# Patient Record
Sex: Female | Born: 1960 | Race: Black or African American | Hispanic: No | Marital: Married | Smoking: Never smoker
Health system: Southern US, Community
[De-identification: ages and names within clinical notes are randomized; demographics above are authoritative.]

## PROBLEM LIST (undated history)

## (undated) DIAGNOSIS — R42 Dizziness and giddiness: Secondary | ICD-10-CM

## (undated) DIAGNOSIS — I10 Essential (primary) hypertension: Secondary | ICD-10-CM

## (undated) DIAGNOSIS — E785 Hyperlipidemia, unspecified: Secondary | ICD-10-CM

## (undated) DIAGNOSIS — K219 Gastro-esophageal reflux disease without esophagitis: Secondary | ICD-10-CM

## (undated) HISTORY — PX: OTHER SURGICAL HISTORY: SHX169

## (undated) HISTORY — DX: Hyperlipidemia, unspecified: E78.5

---

## 1997-12-26 ENCOUNTER — Inpatient Hospital Stay (HOSPITAL_COMMUNITY): Admission: AD | Admit: 1997-12-26 | Discharge: 1997-12-26 | Payer: Self-pay | Admitting: Obstetrics and Gynecology

## 1998-04-12 ENCOUNTER — Ambulatory Visit (HOSPITAL_COMMUNITY): Admission: RE | Admit: 1998-04-12 | Discharge: 1998-04-12 | Payer: Self-pay | Admitting: Obstetrics and Gynecology

## 1998-07-04 ENCOUNTER — Inpatient Hospital Stay (HOSPITAL_COMMUNITY): Admission: AD | Admit: 1998-07-04 | Discharge: 1998-07-04 | Payer: Self-pay | Admitting: Obstetrics and Gynecology

## 1998-07-08 ENCOUNTER — Inpatient Hospital Stay (HOSPITAL_COMMUNITY): Admission: AD | Admit: 1998-07-08 | Discharge: 1998-07-08 | Payer: Self-pay | Admitting: Obstetrics and Gynecology

## 1998-07-11 ENCOUNTER — Inpatient Hospital Stay (HOSPITAL_COMMUNITY): Admission: AD | Admit: 1998-07-11 | Discharge: 1998-07-15 | Payer: Self-pay | Admitting: Obstetrics and Gynecology

## 1998-07-11 ENCOUNTER — Encounter: Payer: Self-pay | Admitting: Obstetrics and Gynecology

## 2001-02-07 ENCOUNTER — Ambulatory Visit (HOSPITAL_COMMUNITY): Admission: RE | Admit: 2001-02-07 | Discharge: 2001-02-07 | Payer: Self-pay | Admitting: Pulmonary Disease

## 2001-05-30 ENCOUNTER — Ambulatory Visit (HOSPITAL_COMMUNITY): Admission: RE | Admit: 2001-05-30 | Discharge: 2001-05-30 | Payer: Self-pay | Admitting: Pulmonary Disease

## 2001-09-26 ENCOUNTER — Other Ambulatory Visit: Admission: RE | Admit: 2001-09-26 | Discharge: 2001-09-26 | Payer: Self-pay | Admitting: Obstetrics and Gynecology

## 2002-06-23 ENCOUNTER — Ambulatory Visit (HOSPITAL_COMMUNITY): Admission: RE | Admit: 2002-06-23 | Discharge: 2002-06-23 | Payer: Self-pay | Admitting: Pulmonary Disease

## 2003-02-27 ENCOUNTER — Encounter: Payer: Self-pay | Admitting: Obstetrics and Gynecology

## 2003-02-27 ENCOUNTER — Ambulatory Visit (HOSPITAL_COMMUNITY): Admission: RE | Admit: 2003-02-27 | Discharge: 2003-02-27 | Payer: Self-pay | Admitting: Obstetrics and Gynecology

## 2004-07-03 ENCOUNTER — Other Ambulatory Visit: Admission: RE | Admit: 2004-07-03 | Discharge: 2004-07-03 | Payer: Self-pay | Admitting: Obstetrics and Gynecology

## 2006-06-28 ENCOUNTER — Ambulatory Visit (HOSPITAL_COMMUNITY): Admission: RE | Admit: 2006-06-28 | Discharge: 2006-06-28 | Payer: Self-pay | Admitting: Pulmonary Disease

## 2009-07-10 ENCOUNTER — Ambulatory Visit (HOSPITAL_BASED_OUTPATIENT_CLINIC_OR_DEPARTMENT_OTHER): Admission: RE | Admit: 2009-07-10 | Discharge: 2009-07-10 | Payer: Self-pay | Admitting: Pulmonary Disease

## 2009-07-10 ENCOUNTER — Ambulatory Visit: Payer: Self-pay | Admitting: Diagnostic Radiology

## 2010-05-19 ENCOUNTER — Ambulatory Visit (HOSPITAL_COMMUNITY): Admission: RE | Admit: 2010-05-19 | Discharge: 2010-05-19 | Payer: Self-pay | Admitting: Pulmonary Disease

## 2010-11-13 ENCOUNTER — Other Ambulatory Visit: Payer: Self-pay | Admitting: Neurology

## 2010-11-13 DIAGNOSIS — R42 Dizziness and giddiness: Secondary | ICD-10-CM

## 2010-11-21 ENCOUNTER — Ambulatory Visit
Admission: RE | Admit: 2010-11-21 | Discharge: 2010-11-21 | Disposition: A | Discharge status: Home / Self Care | Payer: Self-pay | Source: Ambulatory Visit | Attending: Neurology | Admitting: Neurology

## 2010-11-21 DIAGNOSIS — R42 Dizziness and giddiness: Secondary | ICD-10-CM

## 2011-11-10 ENCOUNTER — Other Ambulatory Visit (HOSPITAL_COMMUNITY): Payer: Self-pay | Admitting: Pulmonary Disease

## 2011-11-10 ENCOUNTER — Ambulatory Visit (HOSPITAL_COMMUNITY)
Admission: RE | Admit: 2011-11-10 | Discharge: 2011-11-10 | Disposition: A | Discharge status: Home / Self Care | Payer: BC Managed Care – PPO | Source: Ambulatory Visit | Attending: Pulmonary Disease | Admitting: Pulmonary Disease

## 2011-11-10 DIAGNOSIS — R200 Anesthesia of skin: Secondary | ICD-10-CM

## 2011-11-10 DIAGNOSIS — R209 Unspecified disturbances of skin sensation: Secondary | ICD-10-CM | POA: Insufficient documentation

## 2011-11-10 DIAGNOSIS — Z139 Encounter for screening, unspecified: Secondary | ICD-10-CM

## 2011-11-10 DIAGNOSIS — M542 Cervicalgia: Secondary | ICD-10-CM | POA: Insufficient documentation

## 2011-11-19 ENCOUNTER — Ambulatory Visit (HOSPITAL_COMMUNITY): Payer: BC Managed Care – PPO

## 2011-11-24 ENCOUNTER — Ambulatory Visit (HOSPITAL_COMMUNITY): Payer: BC Managed Care – PPO

## 2012-05-02 ENCOUNTER — Ambulatory Visit (HOSPITAL_COMMUNITY)
Admission: RE | Admit: 2012-05-02 | Discharge: 2012-05-02 | Disposition: A | Discharge status: Home / Self Care | Payer: BC Managed Care – PPO | Source: Ambulatory Visit | Attending: Pulmonary Disease | Admitting: Pulmonary Disease

## 2012-05-02 DIAGNOSIS — Z1231 Encounter for screening mammogram for malignant neoplasm of breast: Secondary | ICD-10-CM | POA: Insufficient documentation

## 2012-05-02 DIAGNOSIS — Z139 Encounter for screening, unspecified: Secondary | ICD-10-CM

## 2012-06-27 ENCOUNTER — Emergency Department (HOSPITAL_COMMUNITY): Payer: BC Managed Care – PPO

## 2012-06-27 ENCOUNTER — Encounter (HOSPITAL_COMMUNITY): Payer: Self-pay

## 2012-06-27 ENCOUNTER — Emergency Department (HOSPITAL_COMMUNITY)
Admission: EM | Admit: 2012-06-27 | Discharge: 2012-06-27 | Disposition: A | Discharge status: Home / Self Care | Payer: BC Managed Care – PPO | Attending: Emergency Medicine | Admitting: Emergency Medicine

## 2012-06-27 DIAGNOSIS — Z79899 Other long term (current) drug therapy: Secondary | ICD-10-CM | POA: Insufficient documentation

## 2012-06-27 DIAGNOSIS — R091 Pleurisy: Secondary | ICD-10-CM

## 2012-06-27 DIAGNOSIS — I10 Essential (primary) hypertension: Secondary | ICD-10-CM | POA: Insufficient documentation

## 2012-06-27 DIAGNOSIS — R05 Cough: Secondary | ICD-10-CM | POA: Insufficient documentation

## 2012-06-27 DIAGNOSIS — R059 Cough, unspecified: Secondary | ICD-10-CM | POA: Insufficient documentation

## 2012-06-27 HISTORY — DX: Essential (primary) hypertension: I10

## 2012-06-27 LAB — BASIC METABOLIC PANEL
BUN: 10 mg/dL (ref 6–23)
CO2: 29 mEq/L (ref 19–32)
Calcium: 9.1 mg/dL (ref 8.4–10.5)
Chloride: 102 mEq/L (ref 96–112)
Creatinine, Ser: 0.77 mg/dL (ref 0.50–1.10)
GFR calc Af Amer: >90 mL/min (ref >90)
GFR calc non Af Amer: >90 mL/min (ref >90)
Glucose, Bld: 98 mg/dL (ref 70–99)
Potassium: 3.5 mEq/L (ref 3.5–5.1)
Sodium: 140 mEq/L (ref 135–145)

## 2012-06-27 LAB — CBC
HCT: 38.6 % (ref 36.0–46.0)
Hemoglobin: 13 g/dL (ref 12.0–15.0)
MCH: 28.4 pg (ref 26.0–34.0)
MCHC: 33.7 g/dL (ref 30.0–36.0)
MCV: 84.3 fL (ref 78.0–100.0)
Platelets: 324 10*3/uL (ref 150–400)
RBC: 4.58 MIL/uL (ref 3.87–5.11)
RDW: 12.9 % (ref 11.5–15.5)
WBC: 14.3 10*3/uL — ABNORMAL HIGH (ref 4.0–10.5)

## 2012-06-27 LAB — D-DIMER, QUANTITATIVE: D-Dimer, Quant: 0.44 ug/mL-FEU (ref 0.00–0.48)

## 2012-06-27 LAB — TROPONIN I: Troponin I: <0.3 ng/mL (ref <0.30)

## 2012-06-27 NOTE — ED Notes (Signed)
Pt reports cp "off and on" for about a week.  Pt reports pressure towards the left side of her chest that radiates to her back.

## 2012-06-27 NOTE — ED Provider Notes (Signed)
History   This chart was scribed for Joya Gaskins, MD by Charolett Bumpers, ED Scribe. The patient was seen in room APA19/APA19. Patient's care was started at 1048.   CSN: [id number]  Arrival date & time 06/27/12  1030   First MD Initiated Contact with Patient 06/27/12 1048      Chief Complaint  Patient presents with  . Chest Pain    The history is provided by the patient. No language interpreter was used.  Rachel Collier is a 52 y.o. female who presents to the Emergency Department complaining of constant, moderate left sided chest pain that radiates to her back that started this morning. She states she has had the same pain intermittently over the past couple of weeks but was mild. She states her symptoms worsened this morning. She states the pain is pressure-like and aggravated with deep breathing. She states she has no pain at rest. She states the back pain is worse with sitting up and movement. She states she had a cough prior to onset of chest pain and now only has a dry intermittent cough. She states she had a episode of reflux yesterday, but none today. She denies any fevers, SOB, severe pain with ambulation, diaphoresis, pain or swelling of legs, abdominal pain, nausea, syncope. She denies any h/o DVT, PE. She denies any cardiac hx.   PCP: Dr. Juanetta Gosling   Past Medical History  Diagnosis Date  . Hypertension     Past Surgical History  Procedure Date  . Cesarean section     No family history on file.  History  Substance Use Topics  . Smoking status: Never Smoker   . Smokeless tobacco: Not on file  . Alcohol Use: No    OB History    Grav Para Term Preterm Abortions TAB SAB Ect Mult Living                  Review of Systems  Constitutional: Negative for fever and diaphoresis.  Respiratory: Positive for cough. Negative for shortness of breath.   Cardiovascular: Positive for chest pain. Negative for leg swelling.  Gastrointestinal: Negative for nausea and  abdominal pain.  Musculoskeletal: Positive for back pain. Negative for gait problem.  Neurological: Negative for syncope.  All other systems reviewed and are negative.    Allergies  Review of patient's allergies indicates no known allergies.  Home Medications   Current Outpatient Rx  Name  Route  Sig  Dispense  Refill  . ACETAMINOPHEN 500 MG PO TABS   Oral   Take 1,000 mg by mouth every 6 (six) hours as needed. Fever/headache         . AMLODIPINE BESY-BENAZEPRIL HCL 5-10 MG PO CAPS   Oral   Take 1 capsule by mouth at bedtime.         . IBUPROFEN 200 MG PO TABS   Oral   Take 400 mg by mouth every 12 (twelve) hours as needed. Fever/aches and pain           BP 136/80  Temp 98.1 F (36.7 C)  Ht 5\' 6"  (1.676 m)  Wt 178 lb (80.74 kg)  BMI 28.73 kg/m2  Physical Exam CONSTITUTIONAL: Well developed/well nourished HEAD AND FACE: Normocephalic/atraumatic EYES: EOMI/PERRL ENMT: Mucous membranes moist NECK: supple no meningeal signs SPINE:entire spine nontender. Tenderness to thoracic paraspinal region CV: S1/S2 noted, no murmurs/rubs/gallops noted Chest - no tenderness noted, no crepitance LUNGS: Lungs are clear to auscultation bilaterally, no apparent distress ABDOMEN: soft,  nontender, no rebound or guarding GU:no cva tenderness NEURO: Pt is awake/alert, moves all extremitiesx4 EXTREMITIES: pulses normal, full ROM, no calf tenderness or edema.  SKIN: warm, color normal PSYCH: no abnormalities of mood noted  ED Course  Procedures   DIAGNOSTIC STUDIES: Oxygen Saturation is 97% on room air, adequate by my interpretation.    COORDINATION OF CARE:  11:10-Discussed planned course of treatment with the patient including a chest x-ray and basic blood screening, who is agreeable at this time.   Pt well appearing, no distress.  I doubt ACS.  Troponin had already been sent and is negative.  Do not feel further troponins needed.  Most of her pain was associated with deep  breathing.  She is low risk and D-dimer negative.  Suspect pleurisy.  Doubt acs/dissection/pericarditis/myocarditis.  Stable for d/c  Results for orders placed during the hospital encounter of 06/27/12  CBC      Component Value Range   WBC 14.3 (*) 4.0 - 10.5 K/uL   RBC 4.58  3.87 - 5.11 MIL/uL   Hemoglobin 13.0  12.0 - 15.0 g/dL   HCT 16.1  09.6 - 04.5 %   MCV 84.3  78.0 - 100.0 fL   MCH 28.4  26.0 - 34.0 pg   MCHC 33.7  30.0 - 36.0 g/dL   RDW 40.9  81.1 - 91.4 %   Platelets 324  150 - 400 K/uL  BASIC METABOLIC PANEL      Component Value Range   Sodium 140  135 - 145 mEq/L   Potassium 3.5  3.5 - 5.1 mEq/L   Chloride 102  96 - 112 mEq/L   CO2 29  19 - 32 mEq/L   Glucose, Bld 98  70 - 99 mg/dL   BUN 10  6 - 23 mg/dL   Creatinine, Ser 7.82  0.50 - 1.10 mg/dL   Calcium 9.1  8.4 - 95.6 mg/dL   GFR calc non Af Amer >90  >90 mL/min   GFR calc Af Amer >90  >90 mL/min  TROPONIN I      Component Value Range   Troponin I <0.30  <0.30 ng/mL   Dg Chest 2 View  06/27/2012  *RADIOLOGY REPORT*  Clinical Data: Hypertension, chest pain.  CHEST - 2 VIEW  Comparison: None.  Findings: Heart is upper limits normal in size.  Mild peribronchial thickening.  No confluent opacities or effusions.  No acute bony abnormality.  IMPRESSION: Borderline heart size.  Mild bronchitic changes.   Original Report Authenticated By: Charlett Nose, M.D.          MDM  Nursing notes including past medical history and social history reviewed and considered in documentation xrays reviewed and considered Labs/vital reviewed and considered      Date: 06/27/2012  Rate: 92  Rhythm: normal sinus rhythm  QRS Axis: normal  Intervals: normal  ST/T Wave abnormalities: normal  Conduction Disutrbances:none  Narrative Interpretation:   Old EKG Reviewed: none available at time of interpretation    I personally performed the services described in this documentation, which was scribed in my presence. The recorded  information has been reviewed and is accurate.         Joya Gaskins, MD 06/27/12 [phone]

## 2012-09-29 ENCOUNTER — Other Ambulatory Visit (HOSPITAL_COMMUNITY): Payer: Self-pay | Admitting: Pulmonary Disease

## 2012-09-29 DIAGNOSIS — R109 Unspecified abdominal pain: Secondary | ICD-10-CM

## 2012-10-04 ENCOUNTER — Ambulatory Visit (HOSPITAL_COMMUNITY)
Admission: RE | Admit: 2012-10-04 | Discharge: 2012-10-04 | Disposition: A | Discharge status: Home / Self Care | Payer: BC Managed Care – PPO | Source: Ambulatory Visit | Attending: Pulmonary Disease | Admitting: Pulmonary Disease

## 2012-10-04 DIAGNOSIS — I10 Essential (primary) hypertension: Secondary | ICD-10-CM | POA: Insufficient documentation

## 2012-10-04 DIAGNOSIS — R109 Unspecified abdominal pain: Secondary | ICD-10-CM | POA: Insufficient documentation

## 2012-10-13 ENCOUNTER — Other Ambulatory Visit (HOSPITAL_COMMUNITY): Payer: Self-pay | Admitting: Pulmonary Disease

## 2012-10-13 DIAGNOSIS — IMO0002 Reserved for concepts with insufficient information to code with codable children: Secondary | ICD-10-CM

## 2012-10-14 ENCOUNTER — Ambulatory Visit (HOSPITAL_COMMUNITY)
Admission: RE | Admit: 2012-10-14 | Discharge: 2012-10-14 | Disposition: A | Discharge status: Home / Self Care | Payer: BC Managed Care – PPO | Source: Ambulatory Visit | Attending: Pulmonary Disease | Admitting: Pulmonary Disease

## 2012-10-14 ENCOUNTER — Encounter (HOSPITAL_COMMUNITY): Payer: Self-pay

## 2012-10-14 DIAGNOSIS — I10 Essential (primary) hypertension: Secondary | ICD-10-CM | POA: Insufficient documentation

## 2012-10-14 DIAGNOSIS — IMO0002 Reserved for concepts with insufficient information to code with codable children: Secondary | ICD-10-CM

## 2012-10-14 DIAGNOSIS — R1031 Right lower quadrant pain: Secondary | ICD-10-CM | POA: Insufficient documentation

## 2012-10-14 MED ORDER — IOHEXOL 300 MG/ML  SOLN
100.0000 mL | Freq: Once | INTRAMUSCULAR | Status: AC | PRN
  Administered 2012-10-14: 100 mL via INTRAVENOUS

## 2012-11-05 ENCOUNTER — Emergency Department (HOSPITAL_COMMUNITY)
Admission: EM | Admit: 2012-11-05 | Discharge: 2012-11-05 | Disposition: A | Discharge status: Home / Self Care | Payer: BC Managed Care – PPO | Attending: Emergency Medicine | Admitting: Emergency Medicine

## 2012-11-05 ENCOUNTER — Encounter (HOSPITAL_COMMUNITY): Payer: Self-pay

## 2012-11-05 DIAGNOSIS — J3489 Other specified disorders of nose and nasal sinuses: Secondary | ICD-10-CM | POA: Insufficient documentation

## 2012-11-05 DIAGNOSIS — R42 Dizziness and giddiness: Secondary | ICD-10-CM

## 2012-11-05 DIAGNOSIS — I10 Essential (primary) hypertension: Secondary | ICD-10-CM | POA: Insufficient documentation

## 2012-11-05 LAB — URINALYSIS, ROUTINE W REFLEX MICROSCOPIC
Bilirubin Urine: NEGATIVE
Glucose, UA: NEGATIVE mg/dL
Ketones, ur: NEGATIVE mg/dL
Leukocytes, UA: NEGATIVE
Nitrite: NEGATIVE
Protein, ur: NEGATIVE mg/dL
Specific Gravity, Urine: 1.015 (ref 1.005–1.030)
Urobilinogen, UA: 0.2 mg/dL (ref 0.0–1.0)
pH: 7.5 (ref 5.0–8.0)

## 2012-11-05 LAB — CBC WITH DIFFERENTIAL/PLATELET
Basophils Absolute: 0 10*3/uL (ref 0.0–0.1)
Basophils Relative: 0 % (ref 0–1)
Eosinophils Absolute: 0 10*3/uL (ref 0.0–0.7)
Eosinophils Relative: 0 % (ref 0–5)
HCT: 41.5 % (ref 36.0–46.0)
Hemoglobin: 14.3 g/dL (ref 12.0–15.0)
Lymphocytes Relative: 16 % (ref 12–46)
Lymphs Abs: 1.3 10*3/uL (ref 0.7–4.0)
MCH: 28.5 pg (ref 26.0–34.0)
MCHC: 34.5 g/dL (ref 30.0–36.0)
MCV: 82.8 fL (ref 78.0–100.0)
Monocytes Absolute: 0.3 10*3/uL (ref 0.1–1.0)
Monocytes Relative: 3 % (ref 3–12)
Neutro Abs: 6.9 10*3/uL (ref 1.7–7.7)
Neutrophils Relative %: 81 % — ABNORMAL HIGH (ref 43–77)
Platelets: 313 10*3/uL (ref 150–400)
RBC: 5.01 MIL/uL (ref 3.87–5.11)
RDW: 13.1 % (ref 11.5–15.5)
WBC: 8.6 10*3/uL (ref 4.0–10.5)

## 2012-11-05 LAB — BASIC METABOLIC PANEL
BUN: 8 mg/dL (ref 6–23)
CO2: 28 mEq/L (ref 19–32)
Calcium: 9.6 mg/dL (ref 8.4–10.5)
Chloride: 100 mEq/L (ref 96–112)
Creatinine, Ser: 0.73 mg/dL (ref 0.50–1.10)
GFR calc Af Amer: >90 mL/min (ref >90)
GFR calc non Af Amer: >90 mL/min (ref >90)
Glucose, Bld: 100 mg/dL — ABNORMAL HIGH (ref 70–99)
Potassium: 3.6 mEq/L (ref 3.5–5.1)
Sodium: 138 mEq/L (ref 135–145)

## 2012-11-05 LAB — URINE MICROSCOPIC-ADD ON

## 2012-11-05 NOTE — ED Provider Notes (Signed)
History  This chart was scribed for Joya Gaskins, MD by Shari Heritage, ED Scribe. The patient was seen in room APA02/APA02. Patient's care was started at 1110.   CSN: [id number]  Arrival date & time 11/05/12  1050   First MD Initiated Contact with Patient 11/05/12 1110      Chief Complaint  Patient presents with  . Dizziness  . Hypertension     The history is provided by the patient. No language interpreter was used.    HPI Comments: Rachel Collier is a 52 y.o. female who presents to the Emergency Department complaining of intermittent, moderate lightheadedness onset this morning. Sensation is worse with position changes. She denies dizziness or spinning sensation. She says that her blood pressure has been elevated for the past several weeks. She measured her BP at home prior to arrival and it was 135/114. Patient states that she took 1 tsp of vinegar then blood pressure decreased. Patient reports that her PMD recently increased her dose of Lotrel. She hasn't taken her BP medicines today and she usually takes them at night. She states that she has also had increased nasal congestion for the past several days. She states that she felt well yesterday. She denies headache, visual changes, chest pain, shortness of breath, vomiting, diarrhea, hematochezia, vaginal bleeding, dysuria, cough, extremity weakness, fever or diaphoresis. Patient has a medical history of hypertension. Patient has no history of MI or stroke.   Past Medical History  Diagnosis Date  . Hypertension     Past Surgical History  Procedure Laterality Date  . Cesarean section      No family history on file.  History  Substance Use Topics  . Smoking status: Never Smoker   . Smokeless tobacco: Not on file  . Alcohol Use: No    OB History   Grav Para Term Preterm Abortions TAB SAB Ect Mult Living                  Review of Systems A complete 10 system review of systems was obtained and all systems are  negative except as noted in the HPI and PMH.   Allergies  Review of patient's allergies indicates no known allergies.  Home Medications   Current Outpatient Rx  Name  Route  Sig  Dispense  Refill  . acetaminophen (TYLENOL) 500 MG tablet   Oral   Take 1,000 mg by mouth every 6 (six) hours as needed. Fever/headache         . amLODipine-benazepril (LOTREL) 5-10 MG per capsule   Oral   Take 1 capsule by mouth at bedtime.         Marland Kitchen ibuprofen (ADVIL,MOTRIN) 200 MG tablet   Oral   Take 400 mg by mouth every 12 (twelve) hours as needed. Fever/aches and pain           Triage Vitals: BP 146/91  Pulse 76  Temp(Src) 98.6 F (37 C) (Oral)  Resp 18  Ht 5\' 6"  (1.676 m)  Wt 176 lb (79.833 kg)  BMI 28.42 kg/m2  SpO2 100%  Physical Exam CONSTITUTIONAL: Well developed/well nourished HEAD: Normocephalic/atraumatic EYES: EOMI/PERRL, no nystagmus ENMT: Mucous membranes moist, bilateral TMs normal NECK: supple no meningeal signs, no bruits SPINE:entire spine nontender CV: S1/S2 noted, no murmurs/rubs/gallops noted LUNGS: Lungs are clear to auscultation bilaterally, no apparent distress ABDOMEN: soft, nontender, no rebound or guarding GU:no cva tenderness NEURO:Awake/alert, facies symmetric, no arm or leg drift is noted Cranial nerves 3/4/5/6/12/28/08/11/12 tested and intact  Gait normal without ataxia No past pointing EXTREMITIES: pulses normal, full ROM SKIN: warm, color normal PSYCH: no abnormalities of mood noted    ED Course  Procedures (including critical care time) DIAGNOSTIC STUDIES: Oxygen Saturation is 100% on room air, normal by my interpretation.    COORDINATION OF CARE: 12:11 PM- Patient informed of current plan for treatment and evaluation and agrees with plan at this time.    Pt now feels improved.  She had mild tachycardia upon standing but no other significant findings.  I doubt CVA.  I doubt MI.  She is ambulatory without difficulty.  EKG change noted but  unlikely significant  Advised to f/u with her PCP this week if no improvement   MDM  Nursing notes including past medical history and social history reviewed and considered in documentation Labs/vital reviewed and considered    Date: 11/05/2012  Rate: 80  Rhythm: normal sinus rhythm  QRS Axis: normal  Intervals: normal  ST/T Wave abnormalities: nonspecific ST changes  Conduction Disutrbances:none  Narrative Interpretation:   Old EKG Reviewed: changes noted (one t wave inversion in V3)     I personally performed the services described in this documentation, which was scribed in my presence. The recorded information has been reviewed and is accurate.      Joya Gaskins, MD 11/05/12 1335

## 2012-11-05 NOTE — ED Notes (Signed)
Pt reports that her bp has been high for about 1 month, her pmd has been changing her meds, today her bp was high, 135/114.  She took 1 tsp of vinegar and was lower. Has been feeling like her "balance was off, just this morning", has not taken any meds today, usually takes her meds at night, denies any cp, no cough or fever.

## 2012-11-05 NOTE — ED Notes (Addendum)
The patient reports a history of feeling lightheaded this morning, states that her blood pressure was elevated at home (approx 135/114).  States that she is also having urinary frequency that started today, denies dysuria.  The pt ambulatory in the department to the restroom with a steady gait.

## 2013-05-30 ENCOUNTER — Emergency Department (HOSPITAL_COMMUNITY)
Admission: EM | Admit: 2013-05-30 | Discharge: 2013-05-30 | Disposition: A | Discharge status: Home / Self Care | Payer: BC Managed Care – PPO | Attending: Emergency Medicine | Admitting: Emergency Medicine

## 2013-05-30 ENCOUNTER — Encounter (HOSPITAL_COMMUNITY): Payer: Self-pay | Admitting: Emergency Medicine

## 2013-05-30 DIAGNOSIS — R12 Heartburn: Secondary | ICD-10-CM | POA: Insufficient documentation

## 2013-05-30 DIAGNOSIS — Z79899 Other long term (current) drug therapy: Secondary | ICD-10-CM | POA: Insufficient documentation

## 2013-05-30 DIAGNOSIS — R0789 Other chest pain: Secondary | ICD-10-CM | POA: Insufficient documentation

## 2013-05-30 DIAGNOSIS — R000 Tachycardia, unspecified: Secondary | ICD-10-CM | POA: Insufficient documentation

## 2013-05-30 DIAGNOSIS — I10 Essential (primary) hypertension: Secondary | ICD-10-CM | POA: Insufficient documentation

## 2013-05-30 LAB — CBC WITH DIFFERENTIAL/PLATELET
Basophils Absolute: 0 10*3/uL (ref 0.0–0.1)
Basophils Relative: 0 % (ref 0–1)
Eosinophils Absolute: 0.1 10*3/uL (ref 0.0–0.7)
Eosinophils Relative: 1 % (ref 0–5)
HCT: 39.7 % (ref 36.0–46.0)
Hemoglobin: 13.4 g/dL (ref 12.0–15.0)
Lymphocytes Relative: 29 % (ref 12–46)
Lymphs Abs: 3.2 10*3/uL (ref 0.7–4.0)
MCH: 28.5 pg (ref 26.0–34.0)
MCHC: 33.8 g/dL (ref 30.0–36.0)
MCV: 84.5 fL (ref 78.0–100.0)
Monocytes Absolute: 0.9 10*3/uL (ref 0.1–1.0)
Monocytes Relative: 8 % (ref 3–12)
Neutro Abs: 6.9 10*3/uL (ref 1.7–7.7)
Neutrophils Relative %: 62 % (ref 43–77)
Platelets: 306 10*3/uL (ref 150–400)
RBC: 4.7 MIL/uL (ref 3.87–5.11)
RDW: 12.8 % (ref 11.5–15.5)
WBC: 11 10*3/uL — ABNORMAL HIGH (ref 4.0–10.5)

## 2013-05-30 LAB — BASIC METABOLIC PANEL
BUN: 7 mg/dL (ref 6–23)
CO2: 25 mEq/L (ref 19–32)
Calcium: 9.2 mg/dL (ref 8.4–10.5)
Chloride: 97 mEq/L (ref 96–112)
Creatinine, Ser: 0.8 mg/dL (ref 0.50–1.10)
GFR calc Af Amer: >90 mL/min (ref >90)
GFR calc non Af Amer: 84 mL/min — ABNORMAL LOW (ref >90)
Glucose, Bld: 147 mg/dL — ABNORMAL HIGH (ref 70–99)
Potassium: 3.4 mEq/L — ABNORMAL LOW (ref 3.5–5.1)
Sodium: 136 mEq/L (ref 135–145)

## 2013-05-30 LAB — TROPONIN I
Troponin I: <0.3 ng/mL (ref <0.30)
Troponin I: <0.3 ng/mL (ref <0.30)

## 2013-05-30 LAB — D-DIMER, QUANTITATIVE: D-Dimer, Quant: 0.39 ug/mL-FEU (ref 0.00–0.48)

## 2013-05-30 MED ORDER — FAMOTIDINE 20 MG PO TABS
20.0000 mg | ORAL_TABLET | Freq: Once | ORAL | Status: AC
  Administered 2013-05-30: 20 mg via ORAL

## 2013-05-30 MED ORDER — FAMOTIDINE 20 MG PO TABS
ORAL_TABLET | ORAL | Status: AC
  Filled 2013-05-30: qty 1

## 2013-05-30 MED ORDER — RANITIDINE HCL 150 MG PO CAPS: 150.0000 mg | ORAL_CAPSULE | Freq: Every day | ORAL | Status: DC

## 2013-05-30 MED ORDER — ONDANSETRON HCL 4 MG/2ML IJ SOLN: 4.0000 mg | Freq: Once | INTRAMUSCULAR | Status: DC

## 2013-05-30 MED ORDER — ONDANSETRON 4 MG PO TBDP
4.0000 mg | ORAL_TABLET | Freq: Once | ORAL | Status: AC
  Administered 2013-05-30: 4 mg via ORAL

## 2013-05-30 MED ORDER — ONDANSETRON 4 MG PO TBDP
ORAL_TABLET | ORAL | Status: AC
  Filled 2013-05-30: qty 1

## 2013-05-30 MED ORDER — GI COCKTAIL ~~LOC~~
30.0000 mL | Freq: Once | ORAL | Status: AC
  Administered 2013-05-30: 30 mL via ORAL
  Filled 2013-05-30: qty 30

## 2013-05-30 NOTE — ED Notes (Signed)
Pt c/o sob since 11pm,

## 2013-05-30 NOTE — ED Provider Notes (Signed)
CSN: [id number]     Arrival date & time 05/30/13  0258 History   None    Chief Complaint  Patient presents with  . Shortness of Breath   (Consider location/radiation/quality/duration/timing/severity/associated sxs/prior Treatment) HPI Comments: Rachel Collier is a 52 year old female with a history of hypertension which is usually well-controlled who presents with a complaint of "heartburn" and shortness of breath since approximately 11:00 this evening. She states that she had a large meal of a hamburger, french fries and ice cream after the sports event this evening, proceeded to lay down on her couch and fall asleep after which time she awoke feeling this discomfort in her chest. This is persistent, nothing seems to make it better though it does get worse when she lays down. She denies any significant pain in her chest but describes it more as heartburn. She has no cough, fever, nausea, vomiting, diaphoresis, swelling of her legs, abdominal pain, dysuria or diarrhea. She does report a recent trip to [place] in the car, does not smoke cigarettes, does not use oral contraceptive pills and has no other risk factors for pulmonary embolism or heart disease. At this time her symptoms are moderate. She does report that until 3 weeks ago she was exercising regularly at the gym and had no dyspnea or limiting symptoms. The reason that she stopped was because of the holiday, not because of symptoms.  Patient is a 52 y.o. female presenting with shortness of breath. The history is provided by the patient and the spouse.  Shortness of Breath   Past Medical History  Diagnosis Date  . Hypertension    Past Surgical History  Procedure Laterality Date  . Cesarean section     History reviewed. No pertinent family history. History  Substance Use Topics  . Smoking status: Never Smoker   . Smokeless tobacco: Not on file  . Alcohol Use: No   OB History   Grav Para Term Preterm Abortions TAB SAB Ect Mult  Living                 Review of Systems  Respiratory: Positive for shortness of breath.   All other systems reviewed and are negative.    Allergies  Review of patient's allergies indicates no known allergies.  Home Medications   Current Outpatient Rx  Name  Route  Sig  Dispense  Refill  . acetaminophen (TYLENOL) 500 MG tablet   Oral   Take 1,000 mg by mouth every 6 (six) hours as needed. Fever/headache         . amLODipine-benazepril (LOTREL) 10-20 MG per capsule   Oral   Take 1 capsule by mouth daily.         Marland Kitchen ibuprofen (ADVIL,MOTRIN) 200 MG tablet   Oral   Take 400 mg by mouth every 12 (twelve) hours as needed. Fever/aches and pain         . loratadine (CLARITIN) 10 MG tablet   Oral   Take 10 mg by mouth daily as needed for allergies.         Marland Kitchen meclizine (ANTIVERT) 25 MG tablet   Oral   Take 25 mg by mouth 3 (three) times daily as needed for dizziness.         . methylPREDNISolone (MEDROL DOSEPAK) 4 MG tablet   Oral   Take 4 mg by mouth. follow package directions         . ranitidine (ZANTAC) 150 MG capsule   Oral   Take 1 capsule (  150 mg total) by mouth daily.   30 capsule   0    BP 133/90  Pulse 98  Temp(Src) 98 F (36.7 C) (Oral)  Resp 20  Ht 5\' 6"  (1.676 m)  Wt 180 lb (81.647 kg)  BMI 29.07 kg/m2  SpO2 96% Physical Exam  Nursing note and vitals reviewed. Constitutional: She appears well-developed and well-nourished. No distress.  HENT:  Head: Normocephalic and atraumatic.  Mouth/Throat: Oropharynx is clear and moist. No oropharyngeal exudate.  Eyes: Conjunctivae and EOM are normal. Pupils are equal, round, and reactive to light. Right eye exhibits no discharge. Left eye exhibits no discharge. No scleral icterus.  Neck: Normal range of motion. Neck supple. No JVD present. No thyromegaly present.  Cardiovascular: Regular rhythm, normal heart sounds and intact distal pulses.  Tachycardia present.  Exam reveals no gallop and no friction  rub.   No murmur heard. Pulmonary/Chest: Effort normal and breath sounds normal. No respiratory distress. She has no wheezes. She has no rales.  Abdominal: Soft. Bowel sounds are normal. She exhibits no distension and no mass. There is no tenderness.  Musculoskeletal: Normal range of motion. She exhibits no edema and no tenderness.  Lymphadenopathy:    She has no cervical adenopathy.  Neurological: She is alert. Coordination normal.  Skin: Skin is warm and dry. No rash noted. No erythema.  Psychiatric: She has a normal mood and affect. Her behavior is normal.    ED Course  Procedures (including critical care time) Labs Review Labs Reviewed  CBC WITH DIFFERENTIAL - Abnormal; Notable for the following:    WBC 11.0 (*)    All other components within normal limits  BASIC METABOLIC PANEL - Abnormal; Notable for the following:    Potassium 3.4 (*)    Glucose, Bld 147 (*)    GFR calc non Af Amer 84 (*)    All other components within normal limits  D-DIMER, QUANTITATIVE  TROPONIN I  TROPONIN I   Imaging Review No results found.  EKG Interpretation    Date/Time:  Tuesday May 30 2013 03:21:38 EST Ventricular Rate:  108 PR Interval:  184 QRS Duration: 76 QT Interval:  348 QTC Calculation: 466 R Axis:   61 Text Interpretation:  Sinus tachycardia Possible Left atrial enlargement Borderline ECG When compared with ECG of 05-Nov-2012 11:54, T wave abnromality in lead 3 with slight change, rate slightly faster Confirmed by Briceida Rasberry  MD, Lora Glomski ([id number]) on 05/30/2013 4:10:46 AM            MDM   1. Chest discomfort    The patient has normal lung sounds, normal heart sounds, a normal exam except for mild tachycardia. Because of her recent travel will obtain a d-dimer to rule out pulmonary embolism though suspect that this could be related to the large meal that she ate this evening.  reexam at 4:20 - has ongoing mild sx, - seems to get worse with laying down - heart rate has slowed  to 96, has nausea.  Labs with normal d dimer and normal BMP / CBC other than scant leukocytosis.  Check Troponin   Has had 2 normal sets of troponins and a normal d dimer - she has improved with meds - pulse remains < 100, endorses feeling better   Pt will f/u with Dr. Juanetta Gosling  Meds given in ED:  Medications  gi cocktail (Maalox,Lidocaine,Donnatal) (30 mLs Oral Given 05/30/13 0324)  famotidine (PEPCID) tablet 20 mg (20 mg Oral Given 05/30/13 0424)  ondansetron (ZOFRAN-ODT)  disintegrating tablet 4 mg (4 mg Oral Given 05/30/13 0425)    New Prescriptions   RANITIDINE (ZANTAC) 150 MG CAPSULE    Take 1 capsule (150 mg total) by mouth daily.      Vida Roller, MD 05/30/13 [phone]

## 2013-06-26 ENCOUNTER — Telehealth: Payer: Self-pay

## 2013-06-26 NOTE — Telephone Encounter (Signed)
Pt was referred by Dr. Luan Pulling for screening colonoscopy.

## 2013-06-26 NOTE — Telephone Encounter (Signed)
LMOM for a return call.  

## 2013-07-06 ENCOUNTER — Telehealth: Payer: Self-pay | Admitting: *Deleted

## 2013-07-06 NOTE — Telephone Encounter (Signed)
Pt called to set up a colonoscopy. Please advise [phone]

## 2013-07-07 ENCOUNTER — Other Ambulatory Visit: Payer: Self-pay

## 2013-07-07 DIAGNOSIS — Z1211 Encounter for screening for malignant neoplasm of colon: Secondary | ICD-10-CM

## 2013-07-07 NOTE — Telephone Encounter (Signed)
See separate note dated 07/06/2013.

## 2013-07-07 NOTE — Telephone Encounter (Signed)
I returned pt's call and LMOM that if she does not return call soon, I will be leaving shortly this morning for a funeral today, and I will be out of the office on Mon.   I will try to call her on Tuesday, 07/11/2013.

## 2013-07-12 NOTE — Telephone Encounter (Signed)
MOVI PREP SPLIT DOSING, REGULAR BREAKFAST. CLEAR LIQUIDS AFTER 9 AM.  

## 2013-07-12 NOTE — Telephone Encounter (Signed)
Gastroenterology Pre-Procedure Review  Request Date: 07/07/2013 Requesting Physician: Dr. Luan Pulling  PATIENT REVIEW QUESTIONS: The patient responded to the following health history questions as indicated:    1. Diabetes Melitis: no 2. Joint replacements in the past 12 months: no 3. Major health problems in the past 3 months: no 4. Has an artificial valve or MVP: no 5. Has a defibrillator: no 6. Has been advised in past to take antibiotics in advance of a procedure like teeth cleaning: no    MEDICATIONS & ALLERGIES:    Patient reports the following regarding taking any blood thinners:   Plavix? no Aspirin? no Coumadin? no  Patient confirms/reports the following medications:  Current Outpatient Prescriptions  Medication Sig Dispense Refill  . acetaminophen (TYLENOL) 500 MG tablet Take 1,000 mg by mouth every 6 (six) hours as needed. Fever/headache      . amLODipine-benazepril (LOTREL) 10-20 MG per capsule Take 1 capsule by mouth daily.      Marland Kitchen ibuprofen (ADVIL,MOTRIN) 200 MG tablet Take 400 mg by mouth every 12 (twelve) hours as needed. Fever/aches and pain      . loratadine (CLARITIN) 10 MG tablet Take 10 mg by mouth daily as needed for allergies.      Marland Kitchen meclizine (ANTIVERT) 25 MG tablet Take 25 mg by mouth 3 (three) times daily as needed for dizziness.      . ranitidine (ZANTAC) 150 MG capsule Take 1 capsule (150 mg total) by mouth daily.  30 capsule  0  . methylPREDNISolone (MEDROL DOSEPAK) 4 MG tablet Take 4 mg by mouth. follow package directions       No current facility-administered medications for this visit.    Patient confirms/reports the following allergies:  No Known Allergies  No orders of the defined types were placed in this encounter.    AUTHORIZATION INFORMATION Primary Insurance:  ID #:   Group #:  Pre-Cert / Auth required:  Pre-Cert / Auth #:   Secondary Insurance:  ID #:   Group #:  Pre-Cert / Auth required: Pre-Cert / Auth #:   SCHEDULE  INFORMATION: Procedure has been scheduled as follows:  Date: 07/31/2013                    Time:  8:30 AM Location: [facility] Short Stay  This Gastroenterology Pre-Precedure Review Form is being routed to the following provider(s): Barney Drain, MD

## 2013-07-13 MED ORDER — PEG-KCL-NACL-NASULF-NA ASC-C 100 G PO SOLR: 1.0000 | ORAL | Status: DC

## 2013-07-13 NOTE — Telephone Encounter (Signed)
Rx sent to the pharmacy and instructions mailed to pt.  

## 2013-07-24 ENCOUNTER — Encounter (HOSPITAL_COMMUNITY): Payer: Self-pay | Admitting: Pharmacy Technician

## 2013-07-28 ENCOUNTER — Telehealth: Payer: Self-pay

## 2013-07-28 NOTE — Telephone Encounter (Signed)
I called [employer] at [phone] and spoke to [place] , who said that a PA is not required for a screening colonoscopy.

## 2013-07-31 ENCOUNTER — Encounter (HOSPITAL_COMMUNITY): Payer: Self-pay | Admitting: *Deleted

## 2013-07-31 ENCOUNTER — Ambulatory Visit (HOSPITAL_COMMUNITY)
Admission: RE | Admit: 2013-07-31 | Discharge: 2013-07-31 | Disposition: A | Discharge status: Home / Self Care | Payer: BC Managed Care – PPO | Source: Ambulatory Visit | Attending: Gastroenterology | Admitting: Gastroenterology

## 2013-07-31 ENCOUNTER — Encounter (HOSPITAL_COMMUNITY)
Admission: RE | Disposition: A | Discharge status: Home / Self Care | Payer: Self-pay | Source: Ambulatory Visit | Attending: Gastroenterology

## 2013-07-31 DIAGNOSIS — Z1211 Encounter for screening for malignant neoplasm of colon: Secondary | ICD-10-CM

## 2013-07-31 DIAGNOSIS — I10 Essential (primary) hypertension: Secondary | ICD-10-CM | POA: Insufficient documentation

## 2013-07-31 DIAGNOSIS — K5730 Diverticulosis of large intestine without perforation or abscess without bleeding: Secondary | ICD-10-CM | POA: Insufficient documentation

## 2013-07-31 DIAGNOSIS — K648 Other hemorrhoids: Secondary | ICD-10-CM | POA: Insufficient documentation

## 2013-07-31 HISTORY — DX: Dizziness and giddiness: R42

## 2013-07-31 HISTORY — PX: COLONOSCOPY: SHX5424

## 2013-07-31 HISTORY — DX: Gastro-esophageal reflux disease without esophagitis: K21.9

## 2013-07-31 SURGERY — COLONOSCOPY
Anesthesia: Moderate Sedation

## 2013-07-31 MED ORDER — MIDAZOLAM HCL 5 MG/5ML IJ SOLN
INTRAMUSCULAR | Status: AC
  Filled 2013-07-31: qty 10

## 2013-07-31 MED ORDER — STERILE WATER FOR IRRIGATION IR SOLN
Status: DC | PRN
  Administered 2013-07-31: 09:00:00

## 2013-07-31 MED ORDER — MEPERIDINE HCL 100 MG/ML IJ SOLN
INTRAMUSCULAR | Status: DC | PRN
  Administered 2013-07-31 (×3): 25 mg via INTRAVENOUS

## 2013-07-31 MED ORDER — MEPERIDINE HCL 100 MG/ML IJ SOLN
INTRAMUSCULAR | Status: AC
  Filled 2013-07-31: qty 2

## 2013-07-31 MED ORDER — SODIUM CHLORIDE 0.9 % IV SOLN
INTRAVENOUS | Status: DC
  Administered 2013-07-31: 08:00:00 via INTRAVENOUS

## 2013-07-31 MED ORDER — MIDAZOLAM HCL 5 MG/5ML IJ SOLN
INTRAMUSCULAR | Status: DC | PRN
  Administered 2013-07-31 (×3): 2 mg via INTRAVENOUS

## 2013-07-31 NOTE — Discharge Instructions (Signed)
You have internal hemorrhoids and diverticulosis IN YOUR LEFT COLON.   Follow a HIGH FIBER DIET. AVOID ITEMS THAT CAUSE BLOATING. See info below.  Next colonoscopy in 10 years.  Colonoscopy Care After Read the instructions outlined below and refer to this sheet in the next week. These discharge instructions provide you with general information on caring for yourself after you leave the hospital. While your treatment has been planned according to the most current medical practices available, unavoidable complications occasionally occur. If you have any problems or questions after discharge, call DR. FIELDS, [phone].  ACTIVITY  You may resume your regular activity, but move at a slower pace for the next 24 hours.   Take frequent rest periods for the next 24 hours.   Walking will help get rid of the air and reduce the bloated feeling in your belly (abdomen).   No driving for 24 hours (because of the medicine (anesthesia) used during the test).   You may shower.   Do not sign any important legal documents or operate any machinery for 24 hours (because of the anesthesia used during the test).    NUTRITION  Drink plenty of fluids.   You may resume your normal diet as instructed by your doctor.   Begin with a light meal and progress to your normal diet. Heavy or fried foods are harder to digest and may make you feel sick to your stomach (nauseated).   Avoid alcoholic beverages for 24 hours or as instructed.    MEDICATIONS  You may resume your normal medications.   WHAT YOU CAN EXPECT TODAY  Some feelings of bloating in the abdomen.   Passage of more gas than usual.   Spotting of blood in your stool or on the toilet paper  .  IF YOU HAD POLYPS REMOVED DURING THE COLONOSCOPY:  Eat a soft diet IF YOU HAVE NAUSEA, BLOATING, ABDOMINAL PAIN, OR VOMITING.    FINDING OUT THE RESULTS OF YOUR TEST Not all test results are available during your visit. DR. Oneida Alar WILL CALL  YOU WITHIN 7 DAYS OF YOUR PROCEDUE WITH YOUR RESULTS. Do not assume everything is normal if you have not heard from DR. FIELDS IN ONE WEEK, CALL HER OFFICE AT [phone].  SEEK IMMEDIATE MEDICAL ATTENTION AND CALL THE OFFICE: [phone] IF:  You have more than a spotting of blood in your stool.   Your belly is swollen (abdominal distention).   You are nauseated or vomiting.   You have a temperature over 101F.   You have abdominal pain or discomfort that is severe or gets worse throughout the day.  High-Fiber Diet A high-fiber diet changes your normal diet to include more whole grains, legumes, fruits, and vegetables. Changes in the diet involve replacing refined carbohydrates with unrefined foods. The calorie level of the diet is essentially unchanged. The Dietary Reference Intake (recommended amount) for adult males is 38 grams per day. For adult females, it is 25 grams per day. Pregnant and lactating women should consume 28 grams of fiber per day. Fiber is the intact part of a plant that is not broken down during digestion. Functional fiber is fiber that has been isolated from the plant to provide a beneficial effect in the body. PURPOSE  Increase stool bulk.   Ease and regulate bowel movements.   Lower cholesterol.  INDICATIONS THAT YOU NEED MORE FIBER  Constipation and hemorrhoids.   Uncomplicated diverticulosis (intestine condition) and irritable bowel syndrome.   Weight management.   As a  protective measure against hardening of the arteries (atherosclerosis), diabetes, and cancer.   GUIDELINES FOR INCREASING FIBER IN THE DIET  Start adding fiber to the diet slowly. A gradual increase of about 5 more grams (2 slices of whole-wheat bread, 2 servings of most fruits or vegetables, or 1 bowl of high-fiber cereal) per day is best. Too rapid an increase in fiber may result in constipation, flatulence, and bloating.   Drink enough water and fluids to keep your urine clear or  pale yellow. Water, juice, or caffeine-free drinks are recommended. Not drinking enough fluid may cause constipation.   Eat a variety of high-fiber foods rather than one type of fiber.   Try to increase your intake of fiber through using high-fiber foods rather than fiber pills or supplements that contain small amounts of fiber.   The goal is to change the types of food eaten. Do not supplement your present diet with high-fiber foods, but replace foods in your present diet.  INCLUDE A VARIETY OF FIBER SOURCES  Replace refined and processed grains with whole grains, canned fruits with fresh fruits, and incorporate other fiber sources. White rice, white breads, and most bakery goods contain little or no fiber.   Brown whole-grain rice, buckwheat oats, and many fruits and vegetables are all good sources of fiber. These include: broccoli, Brussels sprouts, cabbage, cauliflower, beets, sweet potatoes, white potatoes (skin on), carrots, tomatoes, eggplant, squash, berries, fresh fruits, and dried fruits.   Cereals appear to be the richest source of fiber. Cereal fiber is found in whole grains and bran. Bran is the fiber-rich outer coat of cereal grain, which is largely removed in refining. In whole-grain cereals, the bran remains. In breakfast cereals, the largest amount of fiber is found in those with "bran" in their names. The fiber content is sometimes indicated on the label.   You may need to include additional fruits and vegetables each day.   In baking, for 1 cup white flour, you may use the following substitutions:   1 cup whole-wheat flour minus 2 tablespoons.   1/2 cup white flour plus 1/2 cup whole-wheat flour.   Diverticulosis Diverticulosis is a common condition that develops when small pouches (diverticula) form in the wall of the colon. The risk of diverticulosis increases with age. It happens more often in people who eat a low-fiber diet. Most individuals with diverticulosis have no  symptoms. Those individuals with symptoms usually experience belly (abdominal) pain, constipation, or loose stools (diarrhea).  HOME CARE INSTRUCTIONS  Increase the amount of fiber in your diet as directed by your caregiver or dietician. This may reduce symptoms of diverticulosis.   Drink at least 6 to 8 glasses of water each day to prevent constipation.   Try not to strain when you have a bowel movement.   Avoiding nuts and seeds to prevent complications is still an uncertain benefit.       FOODS HAVING HIGH FIBER CONTENT INCLUDE:  Fruits. Apple, peach, pear, tangerine, raisins, prunes.   Vegetables. Brussels sprouts, asparagus, broccoli, cabbage, carrot, cauliflower, romaine lettuce, spinach, summer squash, tomato, winter squash, zucchini.   Starchy Vegetables. Baked beans, kidney beans, lima beans, split peas, lentils, potatoes (with skin).   Grains. Whole wheat bread, brown rice, bran flake cereal, plain oatmeal, white rice, shredded wheat, bran muffins.    SEEK IMMEDIATE MEDICAL CARE IF:  You develop increasing pain or severe bloating.   You have an oral temperature above 101F.   You develop vomiting or bowel movements  that are bloody or black.   Hemorrhoids Hemorrhoids are dilated (enlarged) veins around the rectum. Sometimes clots will form in the veins. This makes them swollen and painful. These are called thrombosed hemorrhoids. Causes of hemorrhoids include:  Constipation.   Straining to have a bowel movement.   HEAVY LIFTING HOME CARE INSTRUCTIONS  Eat a well balanced diet and drink 6 to 8 glasses of water every day to avoid constipation. You may also use a bulk laxative.   Avoid straining to have bowel movements.   Keep anal area dry and clean.   Do not use a donut shaped pillow or sit on the toilet for long periods. This increases blood pooling and pain.   Move your bowels when your body has the urge; this will require less straining and will decrease  pain and pressure.

## 2013-07-31 NOTE — Op Note (Signed)
[place], [address]   COLONOSCOPY PROCEDURE REPORT  PATIENT: Rachel Collier, Rachel Collier.  MR#: [id number] BIRTHDATE: 1961/03/05 , 52  yrs. old GENDER: Female ENDOSCOPIST: Barney Drain, MD REFERRED BM:[id number] Luan Pulling, M.D. PROCEDURE DATE:  07/31/2013 PROCEDURE:   Colonoscopy, screening INDICATIONS:Average risk patient for colon cancer. MEDICATIONS: Demerol 75 mg IV and Versed 6 mg IV  DESCRIPTION OF PROCEDURE:    Physical exam was performed.  Informed consent was obtained from the patient after explaining the benefits, risks, and alternatives to procedure.  The patient was connected to monitor and placed in left lateral position. Continuous oxygen was provided by nasal cannula and IV medicine administered through an indwelling cannula.  After administration of sedation and rectal exam, the patients rectum was intubated and the EC-[id number] ([id number])  colonoscope was advanced under direct visualization to the ileum.  The scope was removed slowly by carefully examining the color, texture, anatomy, and integrity mucosa on the way out.  The patient was recovered in endoscopy and discharged home in satisfactory condition.    COLON FINDINGS: The mucosa appeared normal in the terminal ileum.  , Mild diverticulosis was noted in the sigmoid colon.  OTHERWISE NORMAL COLON. Moderate sized internal hemorrhoids were found.  PREP QUALITY: good. CECAL W/D TIME: 10 minutes     COMPLICATIONS: None  ENDOSCOPIC IMPRESSION: 1.   Normal mucosa in the terminal ileum 2.   Mild diverticulosis in the sigmoid colon 3.   Moderate sized internal hemorrhoids   RECOMMENDATIONS: HIGH FIBER DIET TCS IN 10 YEARS       _______________________________ eSignedBarney Drain, MD 07/31/2013 9:11 AM

## 2013-07-31 NOTE — H&P (Signed)
  Primary Care Physician:  Alonza Bogus, MD Primary Gastroenterologist:  Dr. Oneida Alar  Pre-Procedure History & Physical: HPI:  Rachel Collier is a 53 y.o. female here for COLON CANCER SCREENING.   Past Medical History  Diagnosis Date  . Hypertension   . Vertigo   . GERD (gastroesophageal reflux disease)     Past Surgical History  Procedure Laterality Date  . Cesarean section    . Right bunionectomy    . Cyst removed from left foot      Prior to Admission medications   Medication Sig Start Date End Date Taking? Authorizing Provider  amLODipine-benazepril (LOTREL) 10-20 MG per capsule Take 1 capsule by mouth daily.   Yes Historical Provider, MD  ibuprofen (ADVIL,MOTRIN) 200 MG tablet Take 400 mg by mouth every 12 (twelve) hours as needed. Fever/aches and pain   Yes Historical Provider, MD  loratadine (CLARITIN) 10 MG tablet Take 10 mg by mouth daily as needed for allergies.   Yes Historical Provider, MD  meclizine (ANTIVERT) 25 MG tablet Take 25 mg by mouth 3 (three) times daily as needed for dizziness.   Yes Historical Provider, MD  ranitidine (ZANTAC) 150 MG capsule Take 1 capsule (150 mg total) by mouth daily. 05/30/13  Yes Johnna Acosta, MD  acetaminophen (TYLENOL) 500 MG tablet Take 1,000 mg by mouth every 6 (six) hours as needed. Fever/headache    Historical Provider, MD    Allergies as of 07/07/2013  . (No Known Allergies)    Family History  Problem Relation Age of Onset  . Colon cancer Neg Hx     History   Social History  . Marital Status: Married    Spouse Name: N/A    Number of Children: N/A  . Years of Education: N/A   Occupational History  . Not on file.   Social History Main Topics  . Smoking status: Never Smoker   . Smokeless tobacco: Not on file  . Alcohol Use: No  . Drug Use: No  . Sexual Activity: Yes    Birth Control/ Protection: None   Other Topics Concern  . Not on file   Social History Narrative  . No narrative on file     Review of Systems: See HPI, otherwise negative ROS   Physical Exam: BP 146/94  Pulse 98  Temp(Src) 98.1 F (36.7 C) (Oral)  Resp 16  Ht 5\' 6"  (1.676 m)  Wt 178 lb (80.74 kg)  BMI 28.74 kg/m2  SpO2 97% General:   Alert,  pleasant and cooperative in NAD Head:  Normocephalic and atraumatic. Neck:  Supple; Lungs:  Clear throughout to auscultation.    Heart:  Regular rate and rhythm. Abdomen:  Soft, nontender and nondistended. Normal bowel sounds, without guarding, and without rebound.   Neurologic:  Alert and  oriented x4;  grossly normal neurologically.  Impression/Plan:     SCREENING  Plan:  1. TCS TODAY

## 2013-08-02 ENCOUNTER — Encounter (HOSPITAL_COMMUNITY): Payer: Self-pay | Admitting: Gastroenterology

## 2013-10-12 ENCOUNTER — Encounter: Payer: Self-pay | Admitting: *Deleted

## 2013-11-16 ENCOUNTER — Ambulatory Visit (INDEPENDENT_AMBULATORY_CARE_PROVIDER_SITE_OTHER): Payer: BC Managed Care – PPO | Admitting: Gastroenterology

## 2013-11-16 ENCOUNTER — Encounter (INDEPENDENT_AMBULATORY_CARE_PROVIDER_SITE_OTHER): Payer: Self-pay

## 2013-11-16 ENCOUNTER — Encounter: Payer: Self-pay | Admitting: Gastroenterology

## 2013-11-16 ENCOUNTER — Other Ambulatory Visit: Payer: Self-pay | Admitting: Gastroenterology

## 2013-11-16 VITALS — BP 126/87 | HR 96 | Temp 98.0°F | Ht 66.0 in | Wt 180.2 lb

## 2013-11-16 DIAGNOSIS — R1319 Other dysphagia: Secondary | ICD-10-CM | POA: Insufficient documentation

## 2013-11-16 DIAGNOSIS — K219 Gastro-esophageal reflux disease without esophagitis: Secondary | ICD-10-CM

## 2013-11-16 DIAGNOSIS — R1011 Right upper quadrant pain: Secondary | ICD-10-CM | POA: Insufficient documentation

## 2013-11-16 DIAGNOSIS — R1314 Dysphagia, pharyngoesophageal phase: Secondary | ICD-10-CM

## 2013-11-16 DIAGNOSIS — R1310 Dysphagia, unspecified: Secondary | ICD-10-CM | POA: Insufficient documentation

## 2013-11-16 NOTE — Patient Instructions (Signed)
1. Upper endoscopy with Dr. Oneida Alar as scheduled. Please see separate instructions. 2. Continue pantoprazole 40 mg for now.  Gastroesophageal Reflux Disease, Adult Gastroesophageal reflux disease (GERD) happens when acid from your stomach flows up into the esophagus. When acid comes in contact with the esophagus, the acid causes soreness (inflammation) in the esophagus. Over time, GERD may create small holes (ulcers) in the lining of the esophagus. CAUSES   Increased body weight. This puts pressure on the stomach, making acid rise from the stomach into the esophagus.  Smoking. This increases acid production in the stomach.  Drinking alcohol. This causes decreased pressure in the lower esophageal sphincter (valve or ring of muscle between the esophagus and stomach), allowing acid from the stomach into the esophagus.  Late evening meals and a full stomach. This increases pressure and acid production in the stomach.  A malformed lower esophageal sphincter. Sometimes, no cause is found. SYMPTOMS   Burning pain in the lower part of the mid-chest behind the breastbone and in the mid-stomach area. This may occur twice a week or more often.  Trouble swallowing.  Sore throat.  Dry cough.  Asthma-like symptoms including chest tightness, shortness of breath, or wheezing. DIAGNOSIS  Your caregiver may be able to diagnose GERD based on your symptoms. In some cases, X-rays and other tests may be done to check for complications or to check the condition of your stomach and esophagus. TREATMENT  Your caregiver may recommend over-the-counter or prescription medicines to help decrease acid production. Ask your caregiver before starting or adding any new medicines.  HOME CARE INSTRUCTIONS   Change the factors that you can control. Ask your caregiver for guidance concerning weight loss, quitting smoking, and alcohol consumption.  Avoid foods and drinks that make your symptoms worse, such  as:  Caffeine or alcoholic drinks.  Chocolate.  Peppermint or mint flavorings.  Garlic and onions.  Spicy foods.  Citrus fruits, such as oranges, lemons, or limes.  Tomato-based foods such as sauce, chili, salsa, and pizza.  Fried and fatty foods.  Avoid lying down for the 3 hours prior to your bedtime or prior to taking a nap.  Eat small, frequent meals instead of large meals.  Wear loose-fitting clothing. Do not wear anything tight around your waist that causes pressure on your stomach.  Raise the head of your bed 6 to 8 inches with wood blocks to help you sleep. Extra pillows will not help.  Only take over-the-counter or prescription medicines for pain, discomfort, or fever as directed by your caregiver.  Do not take aspirin, ibuprofen, or other nonsteroidal anti-inflammatory drugs (NSAIDs). SEEK IMMEDIATE MEDICAL CARE IF:   You have pain in your arms, neck, jaw, teeth, or back.  Your pain increases or changes in intensity or duration.  You develop nausea, vomiting, or sweating (diaphoresis).  You develop shortness of breath, or you faint.  Your vomit is green, yellow, black, or looks like coffee grounds or blood.  Your stool is red, bloody, or black. These symptoms could be signs of other problems, such as heart disease, gastric bleeding, or esophageal bleeding. MAKE SURE YOU:   Understand these instructions.  Will watch your condition.  Will get help right away if you are not doing well or get worse. Document Released: 03/18/2005 Document Revised: 08/31/2011 Document Reviewed: 12/26/2010 [facility] Patient Information 2014 [place], [place].

## 2013-11-16 NOTE — Assessment & Plan Note (Signed)
Patient to pay attention if symptoms seem to be aggravated by food or activity. EGD as planned.

## 2013-11-16 NOTE — Assessment & Plan Note (Addendum)
53 year old lady who presents with couple month history of new-onset upper GI symptoms. Symptoms include Increased belching, substernal chest pain, heartburn, difficulty swallowing. No prior history of GERD occasionally has right upper quadrant pain but only every few months. Previously gallbladder ultrasound unremarkable. Recommend upper endoscopy with possible esophageal dilation for further evaluation of new onset symptoms which are likely GERD related. Cannot rule out upper esophageal web. She will continue pantoprazole 40 mg for now. Anti-reflex measures discussed. Based on EGD findings she may need to have further evaluation.  I have discussed the risks, alternatives, benefits with regards to but not limited to the risk of reaction to medication, bleeding, infection, perforation and the patient is agreeable to proceed. Written consent to be obtained.

## 2013-11-16 NOTE — Progress Notes (Signed)
Primary Care Physician:  Alonza Bogus, MD  Primary Gastroenterologist:  Barney Drain, MD   Chief Complaint  Patient presents with  . Dysphagia    HPI:  Rachel Collier is a 53 y.o. female here at the request of Dr. Sinda Du for further evaluation of dysphagia.  No prior GERD history. Couple month history of difficulty swallowing. Some sensations that food/drinks/saliva will not go down. Hard to initiate swallow. Feels like pressure in the upper esophagus. Has uncomfortable feeling in the center of chest even without eating. +belching, excessive. Lately heartburn little better on pantoprazole. Was on omeprazole 40mg  daily before (started by urgent care) without relief. Has more of these symptoms with overeating and laying down. No vomiting. BM regular. No melena, brbpr. No exertional chest pain or SOB.   Occasional RUQ pain, last for few minutes. Happens about once every 3 months.   Current Outpatient Prescriptions  Medication Sig Dispense Refill  . amLODipine-benazepril (LOTREL) 10-20 MG per capsule Take 1 capsule by mouth daily.      . pantoprazole (PROTONIX) 40 MG tablet Take 40 mg by mouth daily.        No current facility-administered medications for this visit.    Allergies as of 11/16/2013  . (No Known Allergies)    Past Medical History  Diagnosis Date  . Hypertension   . Vertigo   . GERD (gastroesophageal reflux disease)     Past Surgical History  Procedure Laterality Date  . Cesarean section    . Right bunionectomy    . Cyst removed from left foot    . Colonoscopy N/A 07/31/2013    [facility]:QIONGE mucosa in the terminal ileum/Mild diverticulosis in the sigmoid colon/Moderate sized internal hemorrhoids    Family History  Problem Relation Age of Onset  . Colon cancer Neg Hx     History   Social History  . Marital Status: Married    Spouse Name: N/A    Number of Children: 2  . Years of Education: N/A   Occupational History  . Not on file.    Social History Main Topics  . Smoking status: Never Smoker   . Smokeless tobacco: Not on file  . Alcohol Use: No  . Drug Use: No  . Sexual Activity: Yes    Birth Control/ Protection: None   Other Topics Concern  . Not on file   Social History Narrative  . No narrative on file      ROS:  General: Negative for anorexia, weight loss, fever, chills, fatigue, weakness. Eyes: Negative for vision changes.  ENT: Negative for hoarseness, nasal congestion. See history of present illness CV: Negative for chest pain, angina, palpitations, dyspnea on exertion, peripheral edema.  Respiratory: Negative for dyspnea at rest, dyspnea on exertion, cough, sputum, wheezing.  GI: See history of present illness. GU:  Negative for dysuria, hematuria, urinary incontinence, urinary frequency, nocturnal urination.  MS: Negative for joint pain, low back pain.  Derm: Negative for rash or itching.  Neuro: Negative for weakness, abnormal sensation, seizure, frequent headaches, memory loss, confusion.  Psych: Negative for anxiety, depression, suicidal ideation, hallucinations.  Endo: Negative for unusual weight change.  Heme: Negative for bruising or bleeding. Allergy: Negative for rash or hives.    Physical Examination:  BP 126/87  Pulse 96  Temp(Src) 98 F (36.7 C) (Oral)  Ht 5\' 6"  (1.676 m)  Wt 180 lb 3.2 oz (81.738 kg)  BMI 29.10 kg/m2   General: Well-nourished, well-developed in no acute distress.  Head:  Normocephalic, atraumatic.   Eyes: Conjunctiva pink, no icterus. Mouth: Oropharyngeal mucosa moist and pink , no lesions erythema or exudate. Neck: Supple without thyromegaly, masses, or lymphadenopathy.  Lungs: Clear to auscultation bilaterally.  Heart: Regular rate and rhythm, no murmurs rubs or gallops.  Abdomen: Bowel sounds are normal, nontender, nondistended, no hepatosplenomegaly or masses, no abdominal bruits or    hernia , no rebound or guarding.   Rectal: not  performed Extremities: No lower extremity edema. No clubbing or deformities.  Neuro: Alert and oriented x 4 , grossly normal neurologically.  Skin: Warm and dry, no rash or jaundice.   Psych: Alert and cooperative, normal mood and affect.

## 2013-11-16 NOTE — Progress Notes (Signed)
cc'd to pcp 

## 2013-11-17 ENCOUNTER — Encounter (HOSPITAL_COMMUNITY): Payer: Self-pay | Admitting: Pharmacy Technician

## 2013-11-24 ENCOUNTER — Encounter (HOSPITAL_COMMUNITY)
Admission: RE | Disposition: A | Discharge status: Home / Self Care | Payer: Self-pay | Source: Ambulatory Visit | Attending: Gastroenterology

## 2013-11-24 ENCOUNTER — Encounter (HOSPITAL_COMMUNITY): Payer: Self-pay | Admitting: *Deleted

## 2013-11-24 ENCOUNTER — Ambulatory Visit (HOSPITAL_COMMUNITY)
Admission: RE | Admit: 2013-11-24 | Discharge: 2013-11-24 | Disposition: A | Discharge status: Home / Self Care | Payer: BC Managed Care – PPO | Source: Ambulatory Visit | Attending: Gastroenterology | Admitting: Gastroenterology

## 2013-11-24 DIAGNOSIS — R1310 Dysphagia, unspecified: Secondary | ICD-10-CM

## 2013-11-24 DIAGNOSIS — K222 Esophageal obstruction: Secondary | ICD-10-CM | POA: Insufficient documentation

## 2013-11-24 DIAGNOSIS — I10 Essential (primary) hypertension: Secondary | ICD-10-CM | POA: Insufficient documentation

## 2013-11-24 DIAGNOSIS — Z800 Family history of malignant neoplasm of digestive organs: Secondary | ICD-10-CM | POA: Insufficient documentation

## 2013-11-24 DIAGNOSIS — R1319 Other dysphagia: Secondary | ICD-10-CM

## 2013-11-24 DIAGNOSIS — D131 Benign neoplasm of stomach: Secondary | ICD-10-CM | POA: Insufficient documentation

## 2013-11-24 DIAGNOSIS — R1314 Dysphagia, pharyngoesophageal phase: Secondary | ICD-10-CM

## 2013-11-24 DIAGNOSIS — R1011 Right upper quadrant pain: Secondary | ICD-10-CM

## 2013-11-24 DIAGNOSIS — K219 Gastro-esophageal reflux disease without esophagitis: Secondary | ICD-10-CM | POA: Insufficient documentation

## 2013-11-24 DIAGNOSIS — Z79899 Other long term (current) drug therapy: Secondary | ICD-10-CM | POA: Insufficient documentation

## 2013-11-24 DIAGNOSIS — K2940 Chronic atrophic gastritis without bleeding: Secondary | ICD-10-CM | POA: Insufficient documentation

## 2013-11-24 HISTORY — PX: SAVORY DILATION: SHX5439

## 2013-11-24 HISTORY — PX: ESOPHAGOGASTRODUODENOSCOPY: SHX5428

## 2013-11-24 SURGERY — EGD (ESOPHAGOGASTRODUODENOSCOPY)
Anesthesia: Moderate Sedation

## 2013-11-24 MED ORDER — MINERAL OIL PO OIL
TOPICAL_OIL | ORAL | Status: AC
  Filled 2013-11-24: qty 30

## 2013-11-24 MED ORDER — MIDAZOLAM HCL 5 MG/5ML IJ SOLN
INTRAMUSCULAR | Status: AC
  Filled 2013-11-24: qty 10

## 2013-11-24 MED ORDER — LIDOCAINE VISCOUS 2 % MT SOLN
OROMUCOSAL | Status: AC
  Filled 2013-11-24: qty 15

## 2013-11-24 MED ORDER — MEPERIDINE HCL 100 MG/ML IJ SOLN
INTRAMUSCULAR | Status: DC | PRN
  Administered 2013-11-24 (×4): 25 mg via INTRAVENOUS

## 2013-11-24 MED ORDER — PANTOPRAZOLE SODIUM 40 MG PO TBEC: DELAYED_RELEASE_TABLET | ORAL | Status: DC

## 2013-11-24 MED ORDER — MIDAZOLAM HCL 5 MG/5ML IJ SOLN
INTRAMUSCULAR | Status: DC | PRN
  Administered 2013-11-24: 1 mg via INTRAVENOUS
  Administered 2013-11-24: 2 mg via INTRAVENOUS
  Administered 2013-11-24 (×2): 1 mg via INTRAVENOUS
  Administered 2013-11-24 (×2): 2 mg via INTRAVENOUS
  Administered 2013-11-24: 1 mg via INTRAVENOUS

## 2013-11-24 MED ORDER — LIDOCAINE VISCOUS 2 % MT SOLN
OROMUCOSAL | Status: DC | PRN
  Administered 2013-11-24: 1 via OROMUCOSAL

## 2013-11-24 MED ORDER — MEPERIDINE HCL 100 MG/ML IJ SOLN
INTRAMUSCULAR | Status: AC
  Filled 2013-11-24: qty 2

## 2013-11-24 MED ORDER — STERILE WATER FOR IRRIGATION IR SOLN
Status: DC | PRN
  Administered 2013-11-24: 13:00:00

## 2013-11-24 MED ORDER — SODIUM CHLORIDE 0.9 % IV SOLN
INTRAVENOUS | Status: DC
  Administered 2013-11-24: 12:00:00 via INTRAVENOUS

## 2013-11-24 NOTE — Discharge Instructions (Addendum)
I dilated your esophagus. You have a stricture near the base of your esophagus. You have gastritis. I biopsied your stomach.   CONTINUE PROTONIX. TAKE 30 MINUTES PRIOR TO YOUR FIRST MEAL.  FOLLOW A LOW FAT DIET. SEE INFO BELOW.  YOUR BIOPSY WILL BE BACK IN 7 DAYS.  FOLLOW UP IN 3 MOS.   UPPER ENDOSCOPY AFTER CARE Read the instructions outlined below and refer to this sheet in the next week. These discharge instructions provide you with general information on caring for yourself after you leave the hospital. While your treatment has been planned according to the most current medical practices available, unavoidable complications occasionally occur. If you have any problems or questions after discharge, call DR. Essance Gatti, [phone].  ACTIVITY  You may resume your regular activity, but move at a slower pace for the next 24 hours.   Take frequent rest periods for the next 24 hours.   Walking will help get rid of the air and reduce the bloated feeling in your belly (abdomen).   No driving for 24 hours (because of the medicine (anesthesia) used during the test).   You may shower.   Do not sign any important legal documents or operate any machinery for 24 hours (because of the anesthesia used during the test).    NUTRITION  Drink plenty of fluids.   You may resume your normal diet as instructed by your doctor.   Begin with a light meal and progress to your normal diet. Heavy or fried foods are harder to digest and may make you feel sick to your stomach (nauseated).   Avoid alcoholic beverages for 24 hours or as instructed.    MEDICATIONS  You may resume your normal medications.   WHAT YOU CAN EXPECT TODAY  Some feelings of bloating in the abdomen.   Passage of more gas than usual.    IF YOU HAD A BIOPSY TAKEN DURING THE UPPER ENDOSCOPY:  Eat a soft diet IF YOU HAVE NAUSEA, BLOATING, ABDOMINAL PAIN, OR VOMITING.    FINDING OUT THE RESULTS OF YOUR TEST Not all test  results are available during your visit. DR. Oneida Alar WILL CALL YOU WITHIN 7 DAYS OF YOUR PROCEDUE WITH YOUR RESULTS. Do not assume everything is normal if you have not heard from DR. Yatzari Jonsson IN ONE WEEK, CALL HER OFFICE AT [phone].  SEEK IMMEDIATE MEDICAL ATTENTION AND CALL THE OFFICE: [phone] IF:  You have more than a spotting of blood in your stool.   Your belly is swollen (abdominal distention).   You are nauseated or vomiting.   You have a temperature over 101F.   You have abdominal pain or discomfort that is severe or gets worse throughout the day.    Low-Fat Diet BREADS, CEREALS, PASTA, RICE, DRIED PEAS, AND BEANS These products are high in carbohydrates and most are low in fat. Therefore, they can be increased in the diet as substitutes for fatty foods. They too, however, contain calories and should not be eaten in excess. Cereals can be eaten for snacks as well as for breakfast.  Include foods that contain fiber (fruits, vegetables, whole grains, and legumes). Research shows that fiber may lower blood cholesterol levels, especially the water-soluble fiber found in fruits, vegetables, oat products, and legumes. FRUITS AND VEGETABLES It is good to eat fruits and vegetables. Besides being sources of fiber, both are rich in vitamins and some minerals. They help you get the daily allowances of these nutrients. Fruits and vegetables can be used for snacks  and desserts. MEATS Limit lean meat, chicken, [place], and fish to no more than 6 ounces per day. Beef, Pork, and Lamb Use lean cuts of beef, pork, and lamb. Lean cuts include:  Extra-lean ground beef.  Arm roast.  Sirloin tip.  Center-cut ham.  Round steak.  Loin chops.  Rump roast.  Tenderloin.  Trim all fat off the outside of meats before cooking. It is not necessary to severely decrease the intake of red meat, but lean choices should be made. Lean meat is rich in protein and contains a highly absorbable form of iron.  Premenopausal women, in particular, should avoid reducing lean red meat because this could increase the risk for low red blood cells (iron-deficiency anemia).  Chicken and [place] These are good sources of protein. The fat of poultry can be reduced by removing the skin and underlying fat layers before cooking. Chicken and [place] can be substituted for lean red meat in the diet. Poultry should not be fried or covered with high-fat sauces. Fish and Shellfish Fish is a good source of protein. Shellfish contain cholesterol, but they usually are low in saturated fatty acids. The preparation of fish is important. Like chicken and [place], they should not be fried or covered with high-fat sauces. EGGS Egg whites contain no fat or cholesterol. They can be eaten often. Try 1 to 2 egg whites instead of whole eggs in recipes or use egg substitutes that do not contain yolk.  MILK AND DAIRY PRODUCTS Use skim or 1% milk instead of 2% or whole milk. Decrease whole milk, natural, and processed cheeses. Use nonfat or low-fat (2%) cottage cheese or low-fat cheeses made from vegetable oils. Choose nonfat or low-fat (1 to 2%) yogurt. Experiment with evaporated skim milk in recipes that call for heavy cream. Substitute low-fat yogurt or low-fat cottage cheese for sour cream in dips and salad dressings. Have at least 2 servings of low-fat dairy products, such as 2 glasses of skim (or 1%) milk each day to help get your daily calcium intake.  FATS AND OILS Butterfat, lard, and beef fats are high in saturated fat and cholesterol. These should be avoided.Vegetable fats do not contain cholesterol. AVOID coconut oil, palm oil, and palm kernel oil, WHICH are very high in saturated fats. These should be limited. These fats are often used in bakery goods, processed foods, popcorn, oils, and nondairy creamers. Vegetable shortenings and some peanut butters contain hydrogenated oils, which are also saturated fats. Read the labels on these  foods and check for saturated vegetable oils.  Desirable liquid vegetable oils are corn oil, cottonseed oil, olive oil, canola oil, safflower oil, soybean oil, and sunflower oil. Peanut oil is not as good, but small amounts are acceptable. Buy a heart-healthy tub margarine that has no partially hydrogenated oils in the ingredients. AVOID Mayonnaise and salad dressings often are made from unsaturated fats.  OTHER EATING TIPS Snacks  Most sweets should be limited as snacks. They tend to be rich in calories and fats, and their caloric content outweighs their nutritional value. Some good choices in snacks are graham crackers, melba toast, soda crackers, bagels (no egg), English muffins, fruits, and vegetables. These snacks are preferable to snack crackers, [place] fries, and chips. Popcorn should be air-popped or cooked in small amounts of liquid vegetable oil.  Desserts Eat fruit, low-fat yogurt, and fruit ices instead of pastries, cake, and cookies. Sherbet, angel food cake, gelatin dessert, frozen low-fat yogurt, or other frozen products that do not contain saturated  fat (pure fruit juice bars, frozen ice pops) are also acceptable.   COOKING METHODS Choose those methods that use little or no fat. They include: Poaching.  Braising.  Steaming.  Grilling.  Baking.  Stir-frying.  Broiling.  Microwaving.  Foods can be cooked in a nonstick pan without added fat, or use a nonfat cooking spray in regular cookware. Limit fried foods and avoid frying in saturated fat. Add moisture to lean meats by using water, broth, cooking wines, and other nonfat or low-fat sauces along with the cooking methods mentioned above. Soups and stews should be chilled after cooking. The fat that forms on top after a few hours in the refrigerator should be skimmed off. When preparing meals, avoid using excess salt. Salt can contribute to raising blood pressure in some people.  EATING AWAY FROM HOME Order entres, potatoes,  and vegetables without sauces or butter. When meat exceeds the size of a deck of cards (3 to 4 ounces), the rest can be taken home for another meal. Choose vegetable or fruit salads and ask for low-calorie salad dressings to be served on the side. Use dressings sparingly. Limit high-fat toppings, such as bacon, crumbled eggs, cheese, sunflower seeds, and olives. Ask for heart-healthy tub margarine instead of butter.   Gastritis  Gastritis is an inflammation (the body's way of reacting to injury and/or infection) of the stomach. It is often caused by viral or bacterial (germ) infections. It can also be caused BY ASPIRIN, BC/GOODY POWDER'S, (IBUPROFEN) MOTRIN, OR ALEVE (NAPROXEN), chemicals (including alcohol), SPICY FOODS, and medications. This illness may be associated with generalized malaise (feeling tired, not well), UPPER ABDOMINAL STOMACH cramps, and fever. One common bacterial cause of gastritis is an organism known as H. Pylori. This can be treated with antibiotics.     ESOPHAGEAL STRICTURE  Esophageal strictures can be caused by stomach acid backing up into the tube that carries food from the mouth down to the stomach (lower esophagus).  TREATMENT There are a number of  medicines used to treat reflux/stricture, including: Antacids.  Proton-pump inhibitors: PROTONIX   HOME CARE INSTRUCTIONS Eat 2-3 hours before going to bed.  Try to reach and maintain a healthy weight.  Do not eat just a few very large meals. Instead, eat 4 TO 6 smaller meals throughout the day.  Try to identify foods and beverages that make your symptoms worse, and avoid these.  Avoid tight clothing.  Do not exercise right after eating.

## 2013-11-24 NOTE — H&P (Signed)
  Primary Care Physician:  Alonza Bogus, MD Primary Gastroenterologist:  Dr. Oneida Alar  Pre-Procedure History & Physical: HPI:  Rachel Collier is a 53 y.o. female here for DYSPHAGIA.  Past Medical History  Diagnosis Date  . Hypertension   . Vertigo   . GERD (gastroesophageal reflux disease)     Past Surgical History  Procedure Laterality Date  . Cesarean section    . Right bunionectomy    . Cyst removed from left foot    . Colonoscopy N/A 07/31/2013    XBM:WUXLKG mucosa in the terminal ileum/Mild diverticulosis in the sigmoid colon/Moderate sized internal hemorrhoids    Prior to Admission medications   Medication Sig Start Date End Date Taking? Authorizing Provider  amLODipine-benazepril (LOTREL) 10-20 MG per capsule Take 1 capsule by mouth daily.   Yes Historical Provider, MD  pantoprazole (PROTONIX) 40 MG tablet Take 40 mg by mouth daily.  11/11/13  Yes Historical Provider, MD    Allergies as of 11/16/2013  . (No Known Allergies)    Family History  Problem Relation Age of Onset  . Colon cancer Neg Hx     History   Social History  . Marital Status: Married    Spouse Name: N/A    Number of Children: 2  . Years of Education: N/A   Occupational History  . Not on file.   Social History Main Topics  . Smoking status: Never Smoker   . Smokeless tobacco: Not on file  . Alcohol Use: No  . Drug Use: No  . Sexual Activity: Yes    Birth Control/ Protection: None   Review of Systems: See HPI, otherwise negative ROS   Physical Exam: BP 132/92  Pulse 80  Temp(Src) 98.1 F (36.7 C) (Oral)  Resp 18  Ht 5\' 6"  (1.676 m)  Wt 180 lb (81.647 kg)  BMI 29.07 kg/m2  SpO2 96% General:   Alert,  pleasant and cooperative in NAD Head:  Normocephalic and atraumatic. Neck:  Supple; Lungs:  Clear throughout to auscultation.    Heart:  Regular rate and rhythm. Abdomen:  Soft, nontender and nondistended. Normal bowel sounds, without guarding, and without rebound.    Neurologic:  Alert and  oriented x4;  grossly normal neurologically.  Impression/Plan:     DYSPHAGIA  PLAN:  EGD/DIL TODAY

## 2013-11-24 NOTE — Progress Notes (Signed)
REVIEWED.  

## 2013-11-24 NOTE — Op Note (Signed)
[facility], [id number]   ENDOSCOPY PROCEDURE REPORT  PATIENT: Rachel, Collier.  MR#: [id number] BIRTHDATE: 23-Oct-1960 , 52  yrs. old GENDER: Female  ENDOSCOPIST: Barney Drain, MD REFFERED YT:[id number] Luan Pulling, M.D.  PROCEDURE DATE:  11/24/2013 PROCEDURE:   EGD with biopsy and EGD with dilatation over guidewire   INDICATIONS:1.  dysphagia.   2.  dyspepsia. MEDICATIONS: Demerol 100 mg IV and Versed 10 mg IV TOPICAL ANESTHETIC: Viscous Xylocaine  DESCRIPTION OF PROCEDURE:   After the risks benefits and alternatives of the procedure were thoroughly explained, informed consent was obtained.  The EG-[id number] ([id number])  endoscope was introduced through the mouth and advanced to the second portion of the duodenum. The instrument was slowly withdrawn as the mucosa was carefully examined.  Prior to withdrawal of the scope, the guidwire was placed.  The esophagus was dilated successfully.  The patient was recovered in endoscopy and discharged home in satisfactory condition.    ESOPHAGUS: A stricture was found at the gastroesophageal junction. The stenosis was traversable with the endoscope.   STOMACH: A few sessile polyps were found on the greater curvature of the gastric body.  A biopsy was performed using cold forceps.   Mild non-erosive gastritis (inflammation) was found in the gastric antrum.  Multiple biopsies were performed using cold forceps. DUODENUM: The duodenal mucosa showed no abnormalities in the bulb and second portion of the duodenum.   Dilation was then performed at the gastroesphageal junction Dilator: Savary over guidewire Size(s): 14-16 mm Resistance: minimal Heme: none  COMPLICATIONS: There were no complications.  ENDOSCOPIC IMPRESSION: 1.   Stricture at the gastroesophageal junction 2.   Few gastric polyps on the greater curvature of the gastric body  3.   MILD Non-erosive gastritis  RECOMMENDATIONS: CONTINUE PROTONIX.   TAKE 30 MINUTES PRIOR TO YOUR FIRST MEAL. FOLLOW A LOW FAT DIET. BIOPSY WILL BE BACK IN 7 DAYS.  FOLLOW UP IN 3 MOS.    _______________________________ Lorrin MaisBarney Drain, MD 11/24/2013 1:19 PM

## 2013-11-28 ENCOUNTER — Encounter (HOSPITAL_COMMUNITY): Payer: Self-pay | Admitting: Gastroenterology

## 2013-12-07 ENCOUNTER — Telehealth: Payer: Self-pay | Admitting: Gastroenterology

## 2013-12-07 NOTE — Telephone Encounter (Signed)
Please call pt. HER stomach Bx shows mild gastritis.    CONTINUE PROTONIX. TAKE 30 MINUTES PRIOR TO YOUR FIRST MEAL.  FOLLOW A LOW FAT DIET.  FOLLOW UP IN 3 MOS E15 DYSPHAGIA.

## 2013-12-08 NOTE — Telephone Encounter (Signed)
Called and informed pt.  

## 2013-12-13 NOTE — Telephone Encounter (Signed)
Reminder in epic °

## 2014-02-15 ENCOUNTER — Encounter: Payer: Self-pay | Admitting: Gastroenterology

## 2014-06-21 ENCOUNTER — Encounter: Payer: Self-pay | Admitting: Gastroenterology

## 2014-06-21 ENCOUNTER — Ambulatory Visit (INDEPENDENT_AMBULATORY_CARE_PROVIDER_SITE_OTHER): Payer: BC Managed Care – PPO | Admitting: Gastroenterology

## 2014-06-21 VITALS — BP 125/78 | HR 82 | Temp 98.0°F | Ht 66.0 in | Wt 178.6 lb

## 2014-06-21 DIAGNOSIS — R1310 Dysphagia, unspecified: Secondary | ICD-10-CM

## 2014-06-21 DIAGNOSIS — K219 Gastro-esophageal reflux disease without esophagitis: Secondary | ICD-10-CM

## 2014-06-21 DIAGNOSIS — R1319 Other dysphagia: Secondary | ICD-10-CM

## 2014-06-21 DIAGNOSIS — R1314 Dysphagia, pharyngoesophageal phase: Secondary | ICD-10-CM

## 2014-06-21 NOTE — Assessment & Plan Note (Signed)
Doing well with Protonix. Continue daily. Return in 1 year.

## 2014-06-21 NOTE — Patient Instructions (Signed)
Continue taking Protonix once daily.   We will see you back in 1 year!  Happy New Year!

## 2014-06-21 NOTE — Progress Notes (Signed)
    Referring Provider: Alonza Bogus, MD Primary Care Physician:  Alonza Bogus, MD  Primary GI: Dr. Oneida Alar   Chief Complaint  Patient presents with  . Follow-up    HPI:   Rachel Collier is a 53 y.o. female presenting today with a history of dysphagia. Recent dilation of stricture at GE junction. EGD also showed mild gastritis, no H.pylori.   Protonix once daily, dysphagia symptoms resolved. No abdominal pain. No N/V. No complaints. Colonoscopy is up-to-date.   Past Medical History  Diagnosis Date  . Hypertension   . Vertigo   . GERD (gastroesophageal reflux disease)     Past Surgical History  Procedure Laterality Date  . Cesarean section    . Right bunionectomy    . Cyst removed from left foot    . Colonoscopy N/A 07/31/2013    DUK:RCVKFM mucosa in the terminal ileum/Mild diverticulosis in the sigmoid colon/Moderate sized internal hemorrhoids  . Esophagogastroduodenoscopy N/A 11/24/2013    Dr. Oneida Alar: 1.  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 Few gastric polyps on the greater curvature of the gastric body, 3. MILD Non-erosive gastritis. negative H.pylori  . Savory dilation N/A 11/24/2013    Procedure: SAVORY DILATION;  Surgeon: Danie Binder, MD;  Location: [facility];  Service: Endoscopy;  Laterality: N/A;    Current Outpatient Prescriptions  Medication Sig Dispense Refill  . amLODipine-benazepril (LOTREL) 10-20 MG per capsule Take 1 capsule by mouth daily.    . pantoprazole (PROTONIX) 40 MG tablet 1 po 30 mins prior to meals 30 tablet 11   No current facility-administered medications for this visit.    Allergies as of 06/21/2014  . (No Known Allergies)    Family History  Problem Relation Age of Onset  . Colon cancer Neg Hx     History   Social History  . Marital Status: Married    Spouse Name: N/A    Number of Children: 2  . Years of Education: N/A   Social History Main Topics  . Smoking status: Never Smoker   .  Smokeless tobacco: None  . Alcohol Use: No  . Drug Use: No  . Sexual Activity: Yes    Birth Control/ Protection: None   Other Topics Concern  . None   Social History Narrative    Review of Systems: As mentioned in HPI  Physical Exam: BP 125/78 mmHg  Pulse 82  Temp(Src) 98 F (36.7 C) (Oral)  Ht 5\' 6"  (1.676 m)  Wt 178 lb 9.6 oz (81.012 kg)  BMI 28.84 kg/m2 General:   Alert and oriented. No distress noted. Pleasant and cooperative.  Head:  Normocephalic and atraumatic. Eyes:  Conjuctiva clear without scleral icterus. Mouth:  Oral mucosa pink and moist. Good dentition. No lesions. Heart:  S1, S2 present without murmurs, rubs, or gallops. Regular rate and rhythm. Abdomen:  +BS, soft, non-tender and non-distended. No rebound or guarding. No HSM or masses noted. Msk:  Symmetrical without gross deformities. Normal posture. Extremities:  Without edema. Neurologic:  Alert and  oriented x4;  grossly normal neurologically. Skin:  Intact without significant lesions or rashes. Psych:  Alert and cooperative. Normal mood and affect.

## 2014-06-21 NOTE — Assessment & Plan Note (Signed)
Secondary to stricture. Resolution of symptoms s/p dilation recently.

## 2014-06-26 NOTE — Progress Notes (Signed)
cc'ed to pcp °

## 2014-07-02 IMAGING — CT CT ABDOMEN WO/W CM
2 of 9 series · 11 of 46 positions shown, 18 images · IV contrast (omnipaque)
Comparison: Ultrasound 10/04/2012

CLINICAL DATA: Right-sided flank pain.  Possible left renal mass on
recent ultrasound.

CT ABDOMEN WITHOUT AND WITH CONTRAST
TECHNIQUE: Multidetector CT imaging of the abdomen was performed
following the standard protocol before and during bolus
administration of intravenous contrast.
Contrast: 100mL OMNIPAQUE IOHEXOL 300 MG/ML  SOLN

[Series 3: mpr coro pre contrast 3mm · coronal · non-contrast · 0.49mm/px · 2 of 78 slices shown, 3 images]
[im 26/78  soft-tissue]
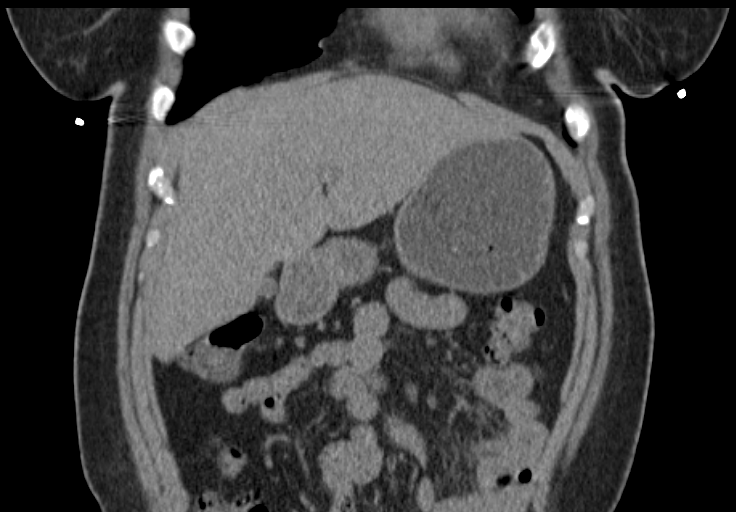
[im 26/78  bone]
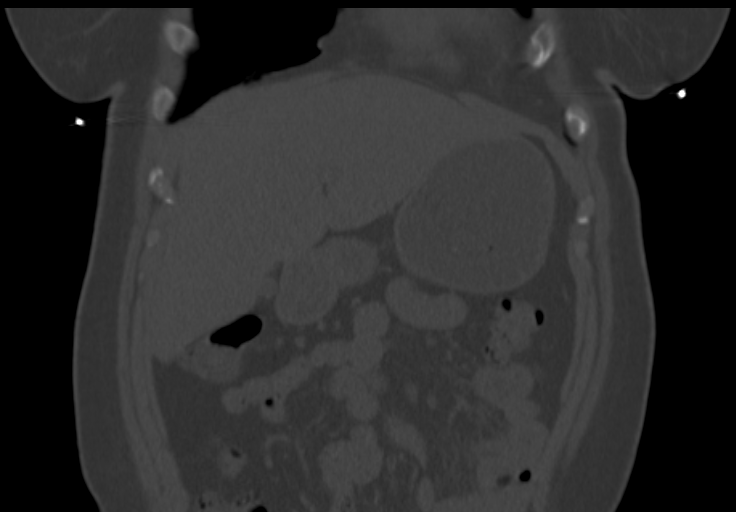
[im 52/78  soft-tissue]
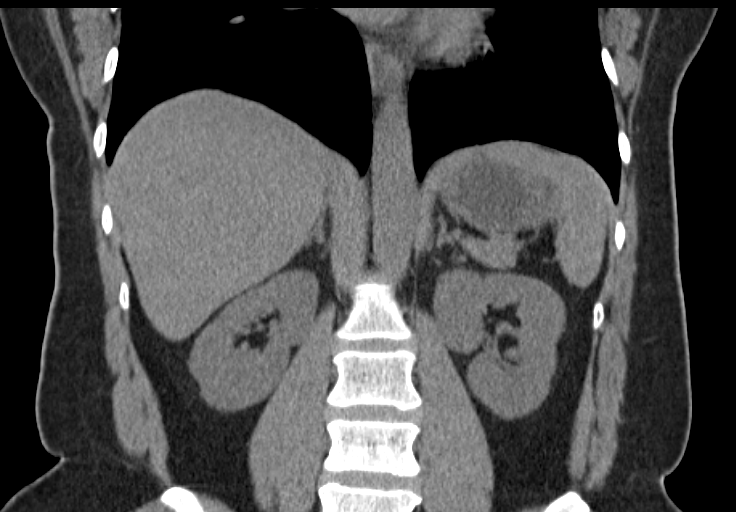

[Series 5: renal arterial 3.0 b30f · axial · arterial · 0.65mm/px · z∈[-267,-69]mm · 9 of 84 slices shown, 15 images]
[im 9/84  soft-tissue]
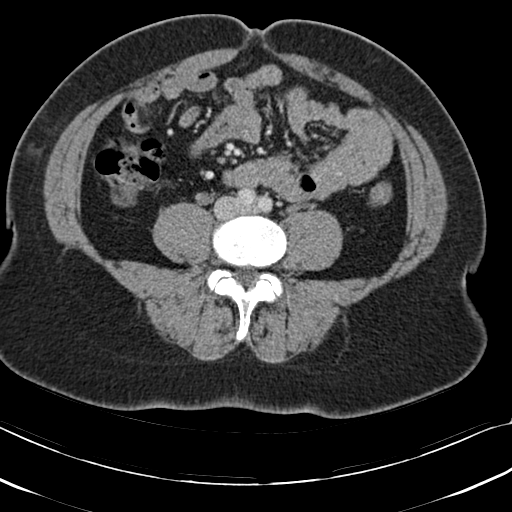
[im 9/84  bone]
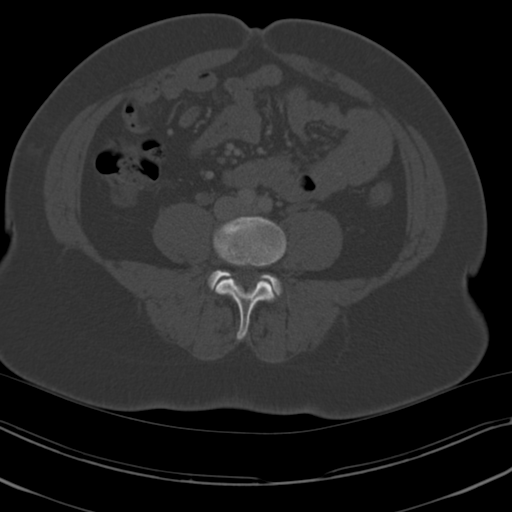
[im 17/84  soft-tissue]
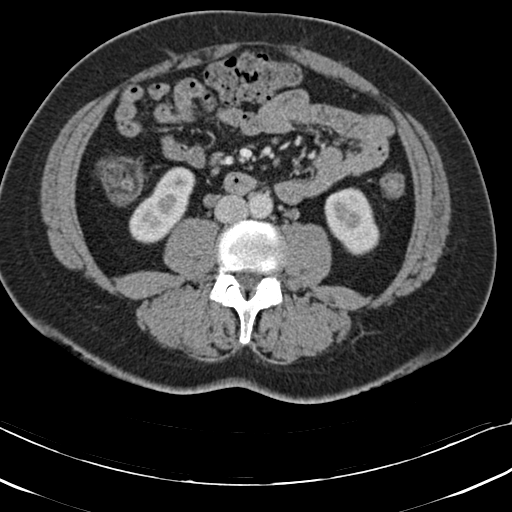
[im 25/84  soft-tissue]
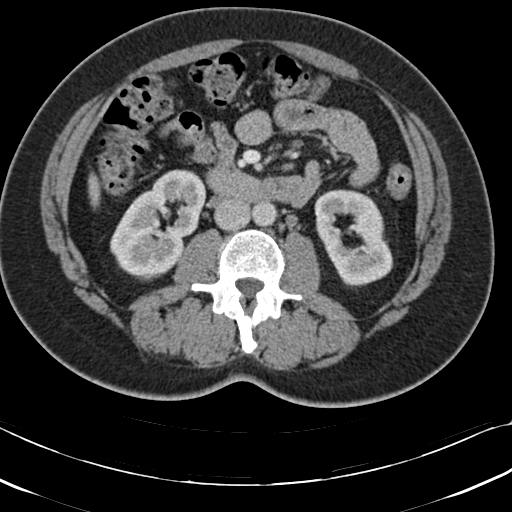
[im 34/84  soft-tissue]
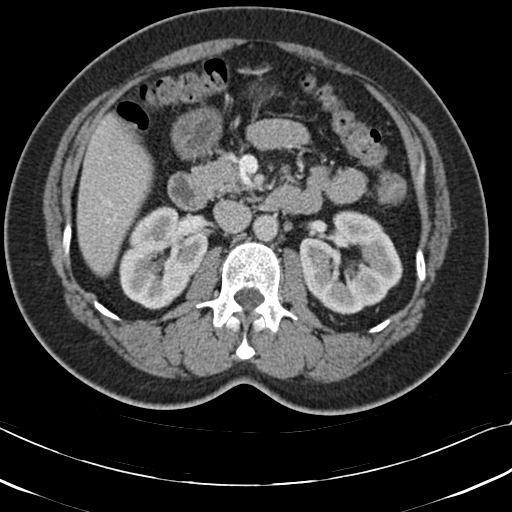
[im 42/84  soft-tissue]
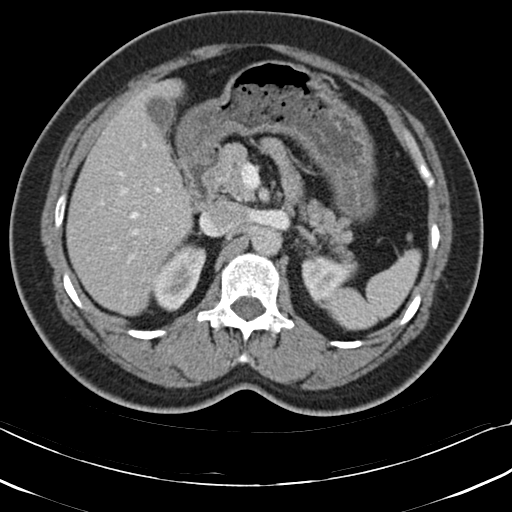
[im 50/84  soft-tissue]
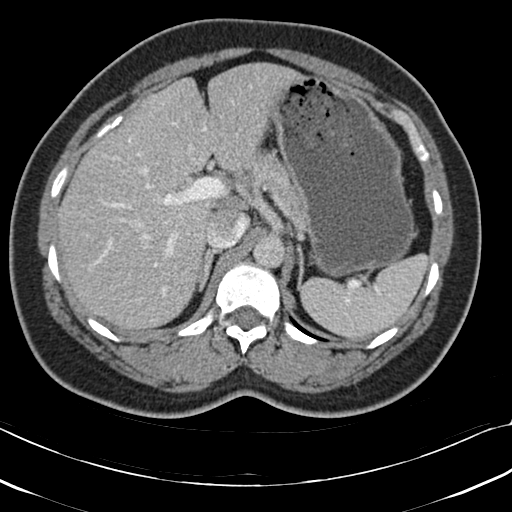
[im 50/84  lung]
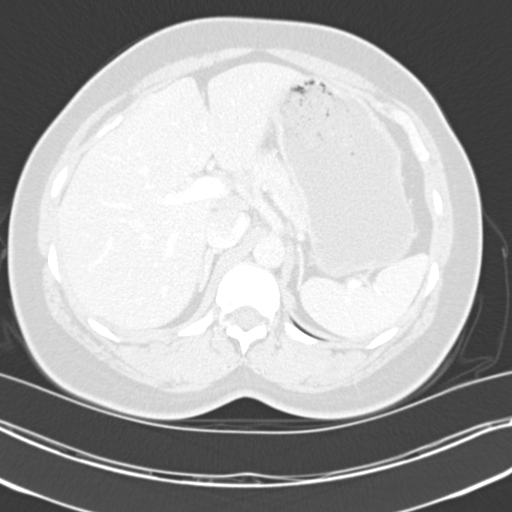
[im 59/84  soft-tissue]
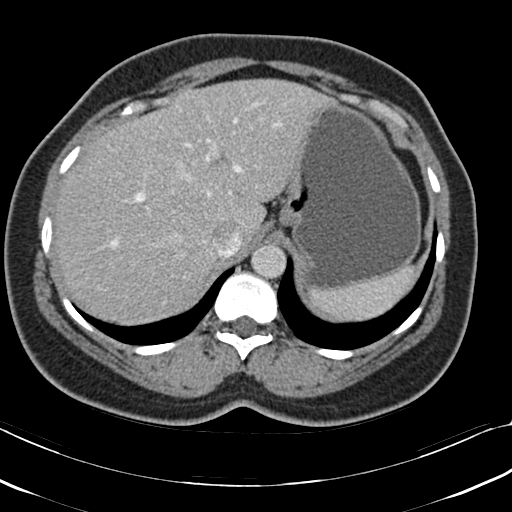
[im 59/84  lung]
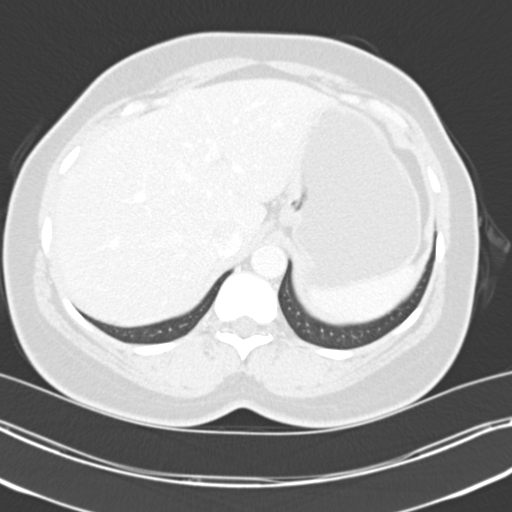
[im 67/84  soft-tissue]
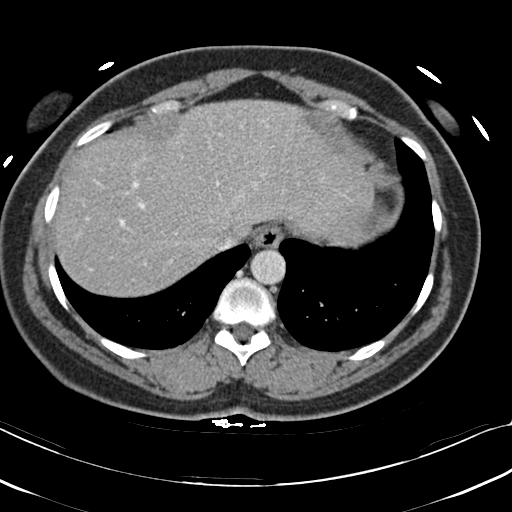
[im 67/84  lung]
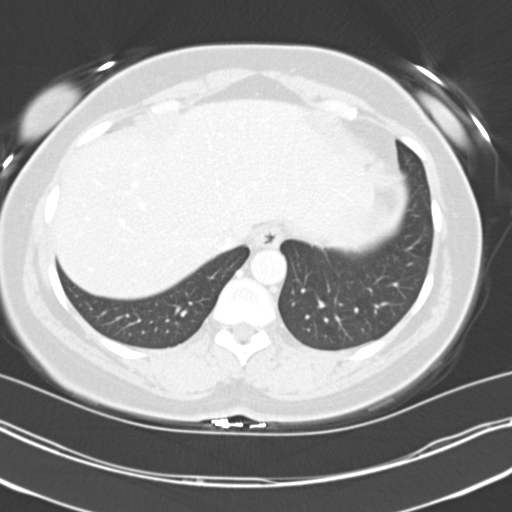
[im 75/84  soft-tissue]
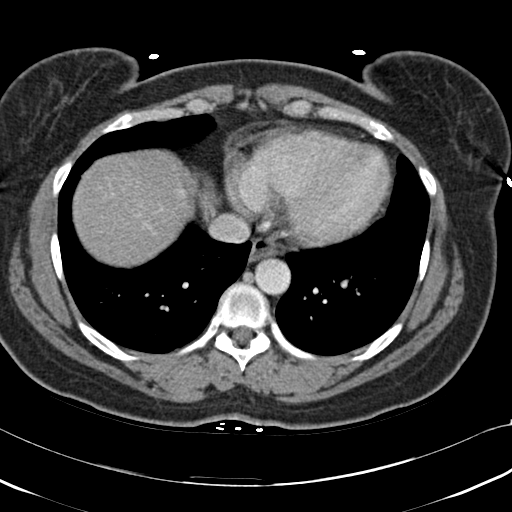
[im 75/84  lung]
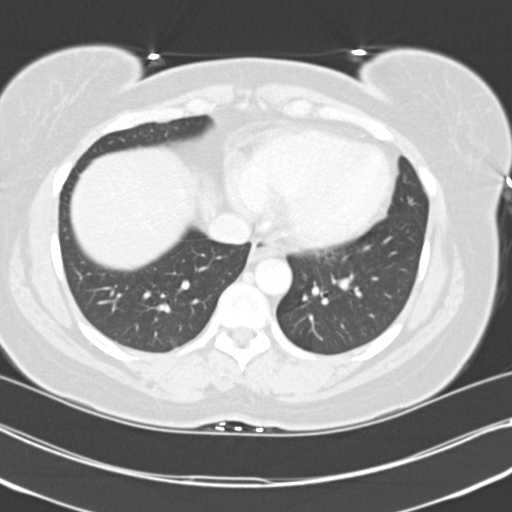
[im 75/84  bone]
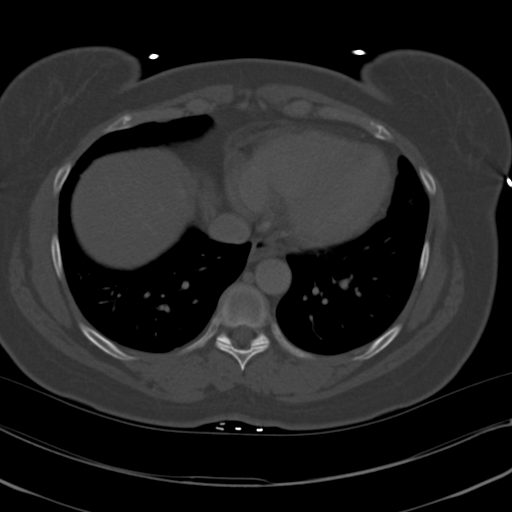

[11 of 46 positions shown; findings below may reference images not displayed]

FINDINGS: No evidence of renal calculi or hydronephrosis.  No
evidence of ureteral calculi or dilatation.

Postcontrast phases show no evidence of renal masses or other
parenchymal lesions.  Excretory phase shows no masses or filling
defects involving the renal collecting systems.

The liver, gallbladder, pancreas, spleen, and adrenal glands are
normal in appearance.  No other soft tissue masses or
lymphadenopathy identified within the abdomen.  No evidence of
inflammatory process or abnormal fluid collections.  Visualized
abdominal bowel loops are nondilated.
IMPRESSION: Negative.  No evidence of renal mass or other significant
abnormality.

## 2015-01-14 ENCOUNTER — Other Ambulatory Visit: Payer: Self-pay | Admitting: Gastroenterology

## 2015-05-06 ENCOUNTER — Encounter: Payer: Self-pay | Admitting: Gastroenterology

## 2016-01-10 ENCOUNTER — Other Ambulatory Visit: Payer: Self-pay | Admitting: Gastroenterology

## 2016-06-24 ENCOUNTER — Other Ambulatory Visit (HOSPITAL_COMMUNITY): Payer: Self-pay | Admitting: Pulmonary Disease

## 2016-06-24 DIAGNOSIS — K210 Gastro-esophageal reflux disease with esophagitis: Secondary | ICD-10-CM | POA: Diagnosis not present

## 2016-06-24 DIAGNOSIS — I10 Essential (primary) hypertension: Secondary | ICD-10-CM | POA: Diagnosis not present

## 2016-06-24 DIAGNOSIS — Z780 Asymptomatic menopausal state: Secondary | ICD-10-CM

## 2016-06-24 DIAGNOSIS — Z0000 Encounter for general adult medical examination without abnormal findings: Secondary | ICD-10-CM | POA: Diagnosis not present

## 2016-06-24 DIAGNOSIS — E785 Hyperlipidemia, unspecified: Secondary | ICD-10-CM | POA: Diagnosis not present

## 2016-07-02 ENCOUNTER — Other Ambulatory Visit (HOSPITAL_COMMUNITY): Payer: Self-pay

## 2016-08-04 DIAGNOSIS — Z1231 Encounter for screening mammogram for malignant neoplasm of breast: Secondary | ICD-10-CM | POA: Diagnosis not present

## 2016-08-04 DIAGNOSIS — Z130 Encounter for screening for diseases of the blood and blood-forming organs and certain disorders involving the immune mechanism: Secondary | ICD-10-CM | POA: Diagnosis not present

## 2016-08-04 DIAGNOSIS — Z01419 Encounter for gynecological examination (general) (routine) without abnormal findings: Secondary | ICD-10-CM | POA: Diagnosis not present

## 2016-08-04 DIAGNOSIS — Z1389 Encounter for screening for other disorder: Secondary | ICD-10-CM | POA: Diagnosis not present

## 2016-08-04 DIAGNOSIS — R3121 Asymptomatic microscopic hematuria: Secondary | ICD-10-CM | POA: Diagnosis not present

## 2016-08-04 DIAGNOSIS — Z6829 Body mass index (BMI) 29.0-29.9, adult: Secondary | ICD-10-CM | POA: Diagnosis not present

## 2017-04-26 ENCOUNTER — Other Ambulatory Visit: Payer: Self-pay | Admitting: Nurse Practitioner

## 2017-08-19 NOTE — Progress Notes (Signed)
REVIEWED-NO ADDITIONAL RECOMMENDATIONS. 

## 2017-11-12 ENCOUNTER — Encounter: Payer: Self-pay | Admitting: Pulmonary Disease

## 2017-11-12 DIAGNOSIS — I10 Essential (primary) hypertension: Secondary | ICD-10-CM | POA: Diagnosis not present

## 2017-11-12 DIAGNOSIS — Z0000 Encounter for general adult medical examination without abnormal findings: Secondary | ICD-10-CM | POA: Diagnosis not present

## 2017-11-12 DIAGNOSIS — E785 Hyperlipidemia, unspecified: Secondary | ICD-10-CM | POA: Diagnosis not present

## 2017-11-12 DIAGNOSIS — K210 Gastro-esophageal reflux disease with esophagitis: Secondary | ICD-10-CM | POA: Diagnosis not present

## 2017-11-13 LAB — LAB REPORT - SCANNED
ALBUMIN/GLOBULIN RATIO: 1.5
ALT: 29 (ref 3–30)
AST: 22
Albumin: 4.4
Alkaline Phosphatase: 76
Basophils Absolute: 30
Calcium: 9.3
Eosinophils Absolute: 18
Eosinophils, %: 0.3
Globulin: 3
HCT: 38 (ref 29–41)
Hemoglobin: 13.1
Lymphocytes absolute: 1830 K/uL — AB (ref 0.1–1.8)
Lymphocytes: 30.5
MCH: 28.7
MCHC: 34.7
MCV: 82.7 (ref 76–111)
MPV: 9.9 fL (ref 7.5–11.5)
Monocytes: 5
Neutro Abs: 3822
Neutrophils: 63.7
RBC: 4.57 (ref 3.87–5.11)
RDW: 12.6
TSH: 1.02
Total Bilirubin: 0.5
Total Protein: 7.4 (ref 6.4–8.2)
WBC: 5
platelet count: 361

## 2017-11-18 DIAGNOSIS — Z1231 Encounter for screening mammogram for malignant neoplasm of breast: Secondary | ICD-10-CM | POA: Diagnosis not present

## 2017-11-18 DIAGNOSIS — Z01419 Encounter for gynecological examination (general) (routine) without abnormal findings: Secondary | ICD-10-CM | POA: Diagnosis not present

## 2017-11-18 DIAGNOSIS — Z1151 Encounter for screening for human papillomavirus (HPV): Secondary | ICD-10-CM | POA: Diagnosis not present

## 2017-11-18 DIAGNOSIS — Z124 Encounter for screening for malignant neoplasm of cervix: Secondary | ICD-10-CM | POA: Diagnosis not present

## 2017-11-18 DIAGNOSIS — Z1389 Encounter for screening for other disorder: Secondary | ICD-10-CM | POA: Diagnosis not present

## 2018-06-28 DIAGNOSIS — J209 Acute bronchitis, unspecified: Secondary | ICD-10-CM | POA: Diagnosis not present

## 2018-06-28 DIAGNOSIS — J329 Chronic sinusitis, unspecified: Secondary | ICD-10-CM | POA: Diagnosis not present

## 2018-06-28 DIAGNOSIS — J069 Acute upper respiratory infection, unspecified: Secondary | ICD-10-CM | POA: Diagnosis not present

## 2019-02-07 DIAGNOSIS — Z1389 Encounter for screening for other disorder: Secondary | ICD-10-CM | POA: Diagnosis not present

## 2019-02-07 DIAGNOSIS — Z01419 Encounter for gynecological examination (general) (routine) without abnormal findings: Secondary | ICD-10-CM | POA: Diagnosis not present

## 2019-02-07 DIAGNOSIS — Z1231 Encounter for screening mammogram for malignant neoplasm of breast: Secondary | ICD-10-CM | POA: Diagnosis not present

## 2019-02-07 DIAGNOSIS — Z130 Encounter for screening for diseases of the blood and blood-forming organs and certain disorders involving the immune mechanism: Secondary | ICD-10-CM | POA: Diagnosis not present

## 2019-02-07 DIAGNOSIS — Z6829 Body mass index (BMI) 29.0-29.9, adult: Secondary | ICD-10-CM | POA: Diagnosis not present

## 2019-04-03 ENCOUNTER — Other Ambulatory Visit: Payer: Self-pay | Admitting: Pulmonary Disease

## 2019-04-03 ENCOUNTER — Other Ambulatory Visit (HOSPITAL_COMMUNITY): Payer: Self-pay | Admitting: Pulmonary Disease

## 2019-04-03 DIAGNOSIS — R221 Localized swelling, mass and lump, neck: Secondary | ICD-10-CM

## 2019-04-07 ENCOUNTER — Other Ambulatory Visit: Payer: Self-pay

## 2019-04-07 ENCOUNTER — Ambulatory Visit (HOSPITAL_COMMUNITY)
Admission: RE | Admit: 2019-04-07 | Discharge: 2019-04-07 | Disposition: A | Discharge status: Home / Self Care | Payer: BC Managed Care – PPO | Source: Ambulatory Visit | Attending: Pulmonary Disease | Admitting: Pulmonary Disease

## 2019-04-07 DIAGNOSIS — I10 Essential (primary) hypertension: Secondary | ICD-10-CM | POA: Diagnosis not present

## 2019-04-07 DIAGNOSIS — K2100 Gastro-esophageal reflux disease with esophagitis, without bleeding: Secondary | ICD-10-CM | POA: Diagnosis not present

## 2019-04-07 DIAGNOSIS — E785 Hyperlipidemia, unspecified: Secondary | ICD-10-CM | POA: Diagnosis not present

## 2019-04-07 DIAGNOSIS — R221 Localized swelling, mass and lump, neck: Secondary | ICD-10-CM | POA: Diagnosis not present

## 2019-04-07 DIAGNOSIS — Z0000 Encounter for general adult medical examination without abnormal findings: Secondary | ICD-10-CM | POA: Diagnosis not present

## 2019-05-10 DIAGNOSIS — Z20828 Contact with and (suspected) exposure to other viral communicable diseases: Secondary | ICD-10-CM | POA: Diagnosis not present

## 2019-05-29 ENCOUNTER — Ambulatory Visit
Admission: EM | Admit: 2019-05-29 | Discharge: 2019-05-29 | Disposition: A | Discharge status: Home / Self Care | Payer: BC Managed Care – PPO | Attending: Emergency Medicine | Admitting: Emergency Medicine

## 2019-05-29 ENCOUNTER — Other Ambulatory Visit: Payer: Self-pay

## 2019-05-29 DIAGNOSIS — Z20828 Contact with and (suspected) exposure to other viral communicable diseases: Secondary | ICD-10-CM | POA: Diagnosis not present

## 2019-05-29 NOTE — ED Triage Notes (Addendum)
Pt presents to UC stating she was exposed to covid 1 week ago. Pt denies symptoms.

## 2019-05-31 LAB — NOVEL CORONAVIRUS, NAA: SARS-CoV-2, NAA: NOT DETECTED

## 2019-06-28 ENCOUNTER — Other Ambulatory Visit: Payer: Self-pay

## 2019-06-28 ENCOUNTER — Ambulatory Visit: Payer: BC Managed Care – PPO | Attending: Internal Medicine

## 2019-06-28 DIAGNOSIS — Z20822 Contact with and (suspected) exposure to covid-19: Secondary | ICD-10-CM

## 2019-06-29 LAB — NOVEL CORONAVIRUS, NAA: SARS-CoV-2, NAA: NOT DETECTED

## 2019-07-16 ENCOUNTER — Ambulatory Visit
Admission: EM | Admit: 2019-07-16 | Discharge: 2019-07-16 | Disposition: A | Discharge status: Home / Self Care | Payer: BC Managed Care – PPO | Attending: Emergency Medicine | Admitting: Emergency Medicine

## 2019-07-16 ENCOUNTER — Other Ambulatory Visit: Payer: Self-pay

## 2019-07-16 DIAGNOSIS — R0981 Nasal congestion: Secondary | ICD-10-CM

## 2019-07-16 DIAGNOSIS — Z20822 Contact with and (suspected) exposure to covid-19: Secondary | ICD-10-CM | POA: Diagnosis not present

## 2019-07-16 DIAGNOSIS — H8112 Benign paroxysmal vertigo, left ear: Secondary | ICD-10-CM

## 2019-07-16 MED ORDER — MECLIZINE HCL 25 MG PO TABS: 25.0000 mg | ORAL_TABLET | Freq: Three times a day (TID) | ORAL | 0 refills | Status: DC | PRN

## 2019-07-16 NOTE — Discharge Instructions (Addendum)
Low suspicion for COVID but will test to be on the safe side Please remain in quarantine until you receive your COVID results COVID test ordered  Rest and push fluids Meclizine prescribed.  Take as directed for symptomatic relief.  This medication may make you drowsy so use with cautions while driving or operating heavy machinery. Follow up with PCP if symptoms persists Return or go to the ER if you have any new or worsening symptoms such as fever, chills, nausea, vomiting, hearing changes, ringing in ears, ear pain, chest pain, passing out, shortness of breath, weakness, slurred speech, memory or emotional changes, facial drooping/ asymmetry, incoordination, numbness or tingling, abdominal pain, changes in bowel or bladder habits, etc..Marland Kitchen

## 2019-07-16 NOTE — ED Provider Notes (Signed)
[place]   [id number] 07/16/19 Arrival Time: [id number]  CC: DIZZINESS  SUBJECTIVE:  Rachel Collier is a 59 y.o. female who presents with complaint of dizziness that began this morning.  Denies a precipitating event, trauma, or recent URI within the past month.  Describes the dizziness as "being on a boat."  States that it is intermittent with episodes lasting few seconds.  Has NOT tried OTC medication.  Symptoms made worse with sudden position changes.  Reports hx of vertigo.  Complains of ear congestion, and nasal congestion.  Denies fever, chills, nausea, vomiting, hearing changes, tinnitus, chest pain, syncope, SOB, weakness, slurred speech, memory or emotional changes, facial drooping/ asymmetry, incoordination, numbness or tingling, abdominal pain, changes in bowel or bladder habits.    ROS: As per HPI.  All other pertinent ROS negative.    Past Medical History:  Diagnosis Date  . GERD (gastroesophageal reflux disease)   . Hypertension   . Vertigo    Past Surgical History:  Procedure Laterality Date  . CESAREAN SECTION    . COLONOSCOPY N/A 07/31/2013   [id number] mucosa in the terminal ileum/Mild diverticulosis in the sigmoid colon/Moderate sized internal hemorrhoids  . Cyst removed from left foot    . ESOPHAGOGASTRODUODENOSCOPY N/A 11/24/2013   Dr. Oneida Alar: 1.  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 Few gastric polyps on the greater curvature of the gastric body, 3. MILD Non-erosive gastritis. negative H.pylori  . Right bunionectomy    . SAVORY DILATION N/A 11/24/2013   Procedure: SAVORY DILATION;  Surgeon: Danie Binder, MD;  Location: [facility];  Service: Endoscopy;  Laterality: N/A;   No Known Allergies No current facility-administered medications on file prior to encounter.   Current Outpatient Medications on File Prior to Encounter  Medication Sig Dispense Refill  . amLODipine-benazepril (LOTREL) 10-20 MG per capsule Take 1 capsule by  mouth daily.    . pantoprazole (PROTONIX) 40 MG tablet TAKE 1 TABLET BY MOUTH DAILY 30 MINUTES PRIOR TO A MEAL 90 tablet 1   Social History   Socioeconomic History  . Marital status: Married    Spouse name: Not on file  . Number of children: 2  . Years of education: Not on file  . Highest education level: Not on file  Occupational History  . Not on file  Tobacco Use  . Smoking status: Never Smoker  Substance and Sexual Activity  . Alcohol use: No  . Drug use: No  . Sexual activity: Yes    Birth control/protection: None  Other Topics Concern  . Not on file  Social History Narrative  . Not on file   Social Determinants of Health   Financial Resource Strain:   . Difficulty of Paying Living Expenses: Not on file  Food Insecurity:   . Worried About Charity fundraiser in the Last Year: Not on file  . Ran Out of Food in the Last Year: Not on file  Transportation Needs:   . Lack of Transportation (Medical): Not on file  . Lack of Transportation (Non-Medical): Not on file  Physical Activity:   . Days of Exercise per Week: Not on file  . Minutes of Exercise per Session: Not on file  Stress:   . Feeling of Stress : Not on file  Social Connections:   . Frequency of Communication with Friends and Family: Not on file  . Frequency of Social Gatherings with Friends and Family: Not on file  . Attends Religious Services: Not  on file  . Active Member of Clubs or Organizations: Not on file  . Attends Archivist Meetings: Not on file  . Marital Status: Not on file  Intimate Partner Violence:   . Fear of Current or Ex-Partner: Not on file  . Emotionally Abused: Not on file  . Physically Abused: Not on file  . Sexually Abused: Not on file   Family History  Problem Relation Age of Onset  . Healthy Mother   . Healthy Father   . Colon cancer Neg Hx     OBJECTIVE:  Vitals:   07/16/19 0953  BP: 131/77  Pulse: 86  Resp: 18  Temp: 97.7 F (36.5 C)  SpO2: 95%      General appearance: alert; no distress Eyes: PERRLA; EOMI; conjunctiva normal HENT: normocephalic; atraumatic; TMs normal; nasal mucosa normal; oral mucosa normal; oropharynx clear Hallpike: positive when head is turned to the left Neck: supple with FROM Lungs: clear to auscultation bilaterally Heart: regular rate and rhythm Extremities: no cyanosis or edema; symmetrical with no gross deformities Skin: warm and dry Neurologic: normal gait; strength and sensation intact about the upper and lower extremities; CN 2-12 grossly intact; negative pronator drift; finger to nose without difficulty Psychological: alert and cooperative; normal mood and affect  ASSESSMENT & PLAN:  1. Suspected COVID-19 virus infection   2. Benign paroxysmal positional vertigo of left ear   3. Nasal congestion     Meds ordered this encounter  Medications  . meclizine (ANTIVERT) 25 MG tablet    Sig: Take 1 tablet (25 mg total) by mouth 3 (three) times daily as needed for dizziness.    Dispense:  60 tablet    Refill:  0    Order Specific Question:   Supervising Provider    Answer:   Raylene Everts [id number]   Low suspicion for COVID but will test to be on the safe side Please remain in quarantine until you receive your COVID results COVID test ordered  Rest and push fluids Meclizine prescribed.  Take as directed for symptomatic relief.  This medication may make you drowsy so use with cautions while driving or operating heavy machinery. Follow up with PCP if symptoms persists Return or go to the ER if you have any new or worsening symptoms such as fever, chills, nausea, vomiting, hearing changes, ringing in ears, ear pain, chest pain, passing out, shortness of breath, weakness, slurred speech, memory or emotional changes, facial drooping/ asymmetry, incoordination, numbness or tingling, abdominal pain, changes in bowel or bladder habits, etc...  Reviewed expectations re: course of current medical issues.  Questions answered. Outlined signs and symptoms indicating need for more acute intervention. Patient verbalized understanding. After Visit Summary given.    Lestine Box, PA-C 07/16/19 1017

## 2019-07-16 NOTE — ED Triage Notes (Signed)
Pt having dizziness that began this morning , pt has h/o vertigo

## 2019-07-17 LAB — NOVEL CORONAVIRUS, NAA: SARS-CoV-2, NAA: NOT DETECTED

## 2019-07-25 ENCOUNTER — Other Ambulatory Visit: Payer: Self-pay

## 2019-07-25 ENCOUNTER — Ambulatory Visit: Payer: BC Managed Care – PPO | Attending: Internal Medicine

## 2019-07-25 DIAGNOSIS — Z20822 Contact with and (suspected) exposure to covid-19: Secondary | ICD-10-CM | POA: Diagnosis not present

## 2019-07-26 LAB — NOVEL CORONAVIRUS, NAA: SARS-CoV-2, NAA: NOT DETECTED

## 2019-09-13 ENCOUNTER — Ambulatory Visit
Admission: EM | Admit: 2019-09-13 | Discharge: 2019-09-13 | Disposition: A | Discharge status: Home / Self Care | Payer: BC Managed Care – PPO | Attending: Emergency Medicine | Admitting: Emergency Medicine

## 2019-09-13 DIAGNOSIS — Z760 Encounter for issue of repeat prescription: Secondary | ICD-10-CM

## 2019-09-13 DIAGNOSIS — I10 Essential (primary) hypertension: Secondary | ICD-10-CM

## 2019-09-13 MED ORDER — AMLODIPINE BESY-BENAZEPRIL HCL 10-20 MG PO CAPS: 1.0000 | ORAL_CAPSULE | Freq: Every day | ORAL | 2 refills | Status: DC

## 2019-09-13 NOTE — ED Provider Notes (Signed)
[place]   [id number] 09/13/19 Arrival Time: [id number]  CC: Medication refill  SUBJECTIVE:  Rachel Collier is a 59 y.o. female who presents for blood pressure medication refill.  Hx of HTN "years."  States blood pressure on average is 120-130's/80.  Takes amlodipine-benazepril 10-40 mg.  Currently does not have a PCP.  Was seen Dr. Luan Pulling, but he is retired.  Had appt with Dr. Holly Bodily, but she is leaving.  Denies HA, vision changes, dizziness, lightheadedness, chest pain, shortness of breath, numbness or tingling in extremities, abdominal pain, changes in bowel or bladder habits.     ROS: As per HPI.  All other pertinent ROS negative.     Past Medical History:  Diagnosis Date  . GERD (gastroesophageal reflux disease)   . Hypertension   . Vertigo    Past Surgical History:  Procedure Laterality Date  . CESAREAN SECTION    . COLONOSCOPY N/A 07/31/2013   [id number] mucosa in the terminal ileum/Mild diverticulosis in the sigmoid colon/Moderate sized internal hemorrhoids  . Cyst removed from left foot    . ESOPHAGOGASTRODUODENOSCOPY N/A 11/24/2013   Dr. Oneida Alar: 1.  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 Few gastric polyps on the greater curvature of the gastric body, 3. MILD Non-erosive gastritis. negative H.pylori  . Right bunionectomy    . SAVORY DILATION N/A 11/24/2013   Procedure: SAVORY DILATION;  Surgeon: Danie Binder, MD;  Location: [facility];  Service: Endoscopy;  Laterality: N/A;   No Known Allergies No current facility-administered medications on file prior to encounter.   Current Outpatient Medications on File Prior to Encounter  Medication Sig Dispense Refill  . meclizine (ANTIVERT) 25 MG tablet Take 1 tablet (25 mg total) by mouth 3 (three) times daily as needed for dizziness. 60 tablet 0  . pantoprazole (PROTONIX) 40 MG tablet TAKE 1 TABLET BY MOUTH DAILY 30 MINUTES PRIOR TO A MEAL 90 tablet 1   Social History   Socioeconomic History   . Marital status: Married    Spouse name: Not on file  . Number of children: 2  . Years of education: Not on file  . Highest education level: Not on file  Occupational History  . Not on file  Tobacco Use  . Smoking status: Never Smoker  Substance and Sexual Activity  . Alcohol use: No  . Drug use: No  . Sexual activity: Yes    Birth control/protection: None  Other Topics Concern  . Not on file  Social History Narrative  . Not on file   Social Determinants of Health   Financial Resource Strain:   . Difficulty of Paying Living Expenses:   Food Insecurity:   . Worried About Charity fundraiser in the Last Year:   . Arboriculturist in the Last Year:   Transportation Needs:   . Film/video editor (Medical):   Marland Kitchen Lack of Transportation (Non-Medical):   Physical Activity:   . Days of Exercise per Week:   . Minutes of Exercise per Session:   Stress:   . Feeling of Stress :   Social Connections:   . Frequency of Communication with Friends and Family:   . Frequency of Social Gatherings with Friends and Family:   . Attends Religious Services:   . Active Member of Clubs or Organizations:   . Attends Archivist Meetings:   Marland Kitchen Marital Status:   Intimate Partner Violence:   . Fear of Current or Ex-Partner:   .  Emotionally Abused:   Marland Kitchen Physically Abused:   . Sexually Abused:    Family History  Problem Relation Age of Onset  . Healthy Mother   . Healthy Father   . Colon cancer Neg Hx     OBJECTIVE:  Vitals:   09/13/19 1456  BP: 124/83  Pulse: 78  Resp: 17  Temp: 98.3 F (36.8 C)  TempSrc: Oral  SpO2: 97%    General appearance: alert; no distress Eyes: PERRLA; EOMI HENT: normocephalic; atraumatic; oropharynx clear Neck: supple with FROM Lungs: clear to auscultation bilaterally Heart: regular rate and rhythm.   Extremities: no edema; symmetrical with no gross deformities Skin: warm and dry Neurologic: ambulates without difficulty Psychological:  alert and cooperative; normal mood and affect  ASSESSMENT & PLAN:  1. Medication refill   2. Essential hypertension     Meds ordered this encounter  Medications  . amLODipine-benazepril (LOTREL) 10-20 MG capsule    Sig: Take 1 capsule by mouth daily.    Dispense:  30 capsule    Refill:  2    Order Specific Question:   Supervising Provider    Answer:   Raylene Everts [id number]   Blood pressure medication refilled Please continue to monitor blood pressure at home and keep a log Eat a well balanced diet of fruits, vegetables and lean meats.  Avoid foods high in fat and salt Drink water.  At least half your body weight in ounces Follow up with PCP  Return or go to the ED if you have any new or worsening symptoms such as vision changes, fatigue, dizziness, chest pain, shortness of breath, nausea, swelling in your hands or feet, urinary symptoms, etc...  Reviewed expectations re: course of current medical issues. Questions answered. Outlined signs and symptoms indicating need for more acute intervention. Patient verbalized understanding. After Visit Summary given.   Lestine Box, PA-C 09/13/19 1518

## 2019-09-13 NOTE — Discharge Instructions (Signed)
Blood pressure medication refilled Please continue to monitor blood pressure at home and keep a log Eat a well balanced diet of fruits, vegetables and lean meats.  Avoid foods high in fat and salt Drink water.  At least half your body weight in ounces Follow up with PCP  Return or go to the ED if you have any new or worsening symptoms such as vision changes, fatigue, dizziness, chest pain, shortness of breath, nausea, swelling in your hands or feet, urinary symptoms, etc..Marland Kitchen

## 2019-09-13 NOTE — ED Triage Notes (Signed)
Pt presents with complaints of needing hypertension medication refilled. Pt denies any symptoms. Amlodipine-benazepril 10-40 mg

## 2019-12-12 ENCOUNTER — Ambulatory Visit: Payer: BC Managed Care – PPO | Admitting: Family Medicine

## 2019-12-18 ENCOUNTER — Ambulatory Visit
Admission: EM | Admit: 2019-12-18 | Discharge: 2019-12-18 | Disposition: A | Discharge status: Home / Self Care | Payer: BC Managed Care – PPO

## 2019-12-18 DIAGNOSIS — R202 Paresthesia of skin: Secondary | ICD-10-CM | POA: Diagnosis not present

## 2019-12-18 DIAGNOSIS — R200 Anesthesia of skin: Secondary | ICD-10-CM | POA: Diagnosis not present

## 2019-12-18 DIAGNOSIS — Z760 Encounter for issue of repeat prescription: Secondary | ICD-10-CM | POA: Diagnosis not present

## 2019-12-18 DIAGNOSIS — M25512 Pain in left shoulder: Secondary | ICD-10-CM

## 2019-12-18 MED ORDER — AMLODIPINE BESY-BENAZEPRIL HCL 10-20 MG PO CAPS: 1.0000 | ORAL_CAPSULE | Freq: Every day | ORAL | 2 refills | Status: DC

## 2019-12-18 MED ORDER — GABAPENTIN 100 MG PO CAPS: 100.0000 mg | ORAL_CAPSULE | Freq: Three times a day (TID) | ORAL | 0 refills | Status: DC

## 2019-12-18 MED ORDER — ATORVASTATIN CALCIUM 10 MG PO TABS: 10.0000 mg | ORAL_TABLET | Freq: Every day | ORAL | 2 refills | Status: DC

## 2019-12-18 NOTE — ED Triage Notes (Signed)
Pt also has some left shoulder pain and tingling

## 2019-12-18 NOTE — ED Triage Notes (Signed)
Pt been unable to get in with pcp and needs bp med refill

## 2019-12-18 NOTE — ED Provider Notes (Signed)
[place]   [id number] 12/18/19 Arrival Time: [place]   Chief Complaint  Patient presents with  . Medication Refill     SUBJECTIVE: History from: patient.  Rachel Collier is a 59 y.o. female left shoulder pain for the past few days.  Recently reporting tip of finger tingling and numbness.  Denies any precipitating event.  Localized pain to the left shoulder.  She describes the pain as intermittent  and achy.  She has tried OTC Tylenol with relief.  His symptoms are made worse with ROM.  She denies similar symptoms in the past.   Patient would like to get medication refill for her hypertension and cholesterol medications.  Has not been able to establish care with a PCP.  Has been taking Lotrel 10 - 20 mg daily and atorvastatin 10 mg daily.  ROS: As per HPI.  All other pertinent ROS negative.     Past Medical History:  Diagnosis Date  . GERD (gastroesophageal reflux disease)   . Hypertension   . Vertigo    Past Surgical History:  Procedure Laterality Date  . CESAREAN SECTION    . COLONOSCOPY N/A 07/31/2013   [id number] mucosa in the terminal ileum/Mild diverticulosis in the sigmoid colon/Moderate sized internal hemorrhoids  . Cyst removed from left foot    . ESOPHAGOGASTRODUODENOSCOPY N/A 11/24/2013   Dr. Oneida Alar: 1.  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 Few gastric polyps on the greater curvature of the gastric body, 3. MILD Non-erosive gastritis. negative H.pylori  . Right bunionectomy    . SAVORY DILATION N/A 11/24/2013   Procedure: SAVORY DILATION;  Surgeon: Danie Binder, MD;  Location: [facility];  Service: Endoscopy;  Laterality: N/A;   No Known Allergies No current facility-administered medications on file prior to encounter.   Current Outpatient Medications on File Prior to Encounter  Medication Sig Dispense Refill  . meclizine (ANTIVERT) 25 MG tablet Take 1 tablet (25 mg total) by mouth 3 (three) times daily as needed for  dizziness. 60 tablet 0  . pantoprazole (PROTONIX) 40 MG tablet TAKE 1 TABLET BY MOUTH DAILY 30 MINUTES PRIOR TO A MEAL 90 tablet 1   Social History   Socioeconomic History  . Marital status: Married    Spouse name: Not on file  . Number of children: 2  . Years of education: Not on file  . Highest education level: Not on file  Occupational History  . Not on file  Tobacco Use  . Smoking status: Never Smoker  Substance and Sexual Activity  . Alcohol use: No  . Drug use: No  . Sexual activity: Yes    Birth control/protection: None  Other Topics Concern  . Not on file  Social History Narrative  . Not on file   Social Determinants of Health   Financial Resource Strain:   . Difficulty of Paying Living Expenses:   Food Insecurity:   . Worried About Charity fundraiser in the Last Year:   . Arboriculturist in the Last Year:   Transportation Needs:   . Film/video editor (Medical):   Marland Kitchen Lack of Transportation (Non-Medical):   Physical Activity:   . Days of Exercise per Week:   . Minutes of Exercise per Session:   Stress:   . Feeling of Stress :   Social Connections:   . Frequency of Communication with Friends and Family:   . Frequency of Social Gatherings with Friends and Family:   . Attends  Religious Services:   . Active Member of Clubs or Organizations:   . Attends Archivist Meetings:   Marland Kitchen Marital Status:   Intimate Partner Violence:   . Fear of Current or Ex-Partner:   . Emotionally Abused:   Marland Kitchen Physically Abused:   . Sexually Abused:    Family History  Problem Relation Age of Onset  . Healthy Mother   . Healthy Father   . Colon cancer Neg Hx     OBJECTIVE:  Vitals:   12/18/19 1326  BP: 109/74  Pulse: 95  Resp: 20  Temp: 98.3 F (36.8 C)  SpO2: 98%     Physical Exam Vitals and nursing note reviewed.  Constitutional:      General: She is not in acute distress.    Appearance: Normal appearance. She is normal weight. She is not  ill-appearing, toxic-appearing or diaphoretic.  Cardiovascular:     Rate and Rhythm: Normal rate and regular rhythm.     Pulses: Normal pulses.     Heart sounds: Normal heart sounds. No murmur heard.  No friction rub. No gallop.   Pulmonary:     Effort: Pulmonary effort is normal. No respiratory distress.     Breath sounds: Normal breath sounds. No stridor. No wheezing, rhonchi or rales.  Chest:     Chest wall: No tenderness.  Musculoskeletal:        General: No swelling, tenderness, deformity or signs of injury. Normal range of motion.     Right shoulder: Normal. No swelling, deformity, effusion or laceration. Normal strength.     Left shoulder: Normal. No swelling, deformity, effusion or laceration. Normal strength.     Right lower leg: No edema.     Left lower leg: No edema.     Comments: The left shoulder is without any obvious asymmetry or deformity when compared to the right.  There is no surface trauma, ecchymosis,open wound.  Normal range of motion.  Neurovascular status intact.  Neurological:     General: No focal deficit present.     Mental Status: She is alert and oriented to person, place, and time.     Cranial Nerves: No cranial nerve deficit.     Sensory: No sensory deficit.     Motor: No weakness.     Coordination: Coordination normal.     LABS:  No results found for this or any previous visit (from the past 24 hour(s)).   ASSESSMENT & PLAN:  1. Acute pain of left shoulder   2. Encounter for medication refill   3. Numbness and tingling     Meds ordered this encounter  Medications  . amLODipine-benazepril (LOTREL) 10-20 MG capsule    Sig: Take 1 capsule by mouth daily.    Dispense:  30 capsule    Refill:  2  . atorvastatin (LIPITOR) 10 MG tablet    Sig: Take 1 tablet (10 mg total) by mouth daily.    Dispense:  30 tablet    Refill:  2  . gabapentin (NEURONTIN) 100 MG capsule    Sig: Take 1 capsule (100 mg total) by mouth 3 (three) times daily.     Dispense:  60 capsule    Refill:  0    Discharge instructions Take take medication as prescribed Establish and follow-up with PCP Return or go to ED for worsening of symptoms  Reviewed expectations re: course of current medical issues. Questions answered. Outlined signs and symptoms indicating need for more acute intervention. Patient verbalized understanding.  After Visit Summary given.      Note: This document was prepared using Dragon voice recognition software and may include unintentional dictation errors.    Emerson Monte, [place] 12/18/19 1344

## 2019-12-18 NOTE — Discharge Instructions (Signed)
Take take medication as prescribed Establish and follow-up with PCP Return or go to ED for worsening of symptoms

## 2020-01-10 ENCOUNTER — Other Ambulatory Visit: Payer: Self-pay

## 2020-01-10 ENCOUNTER — Ambulatory Visit (INDEPENDENT_AMBULATORY_CARE_PROVIDER_SITE_OTHER): Payer: BC Managed Care – PPO | Admitting: Internal Medicine

## 2020-01-10 ENCOUNTER — Encounter (INDEPENDENT_AMBULATORY_CARE_PROVIDER_SITE_OTHER): Payer: Self-pay | Admitting: Internal Medicine

## 2020-01-10 VITALS — BP 136/80 | HR 78 | Temp 97.4°F | Resp 18 | Ht 66.0 in | Wt 184.0 lb

## 2020-01-10 DIAGNOSIS — E663 Overweight: Secondary | ICD-10-CM | POA: Diagnosis not present

## 2020-01-10 DIAGNOSIS — E782 Mixed hyperlipidemia: Secondary | ICD-10-CM

## 2020-01-10 DIAGNOSIS — I10 Essential (primary) hypertension: Secondary | ICD-10-CM

## 2020-01-10 NOTE — Progress Notes (Signed)
Metrics: Intervention Frequency ACO  Documented Smoking Status Yearly  Screened one or more times in 24 months  Cessation Counseling or  Active cessation medication Past 24 months  Past 24 months   Guideline developer: UpToDate (See UpToDate for funding source) Date Released: 2014       Wellness Office Visit  Subjective:  Patient ID: Rachel Collier, female    DOB: 09-25-1960  Age: 59 y.o. MRN: [id number]  CC: This 59 year old lady comes in as a new patient to establish care.  Her previous physician, Dr. Luan Pulling, has retired. HPI  She has a history of hypertension and hyperlipidemia.  She takes medications for this. She has no history of coronary artery disease, cerebrovascular disease and is not a diabetic. Past Medical History:  Diagnosis Date  . Hyperlipidemia   . Hypertension   . Vertigo    Past Surgical History:  Procedure Laterality Date  . CESAREAN SECTION    . COLONOSCOPY N/A 07/31/2013   [id number] mucosa in the terminal ileum/Mild diverticulosis in the sigmoid colon/Moderate sized internal hemorrhoids  . Cyst removed from left foot    . ESOPHAGOGASTRODUODENOSCOPY N/A 11/24/2013   Dr. Oneida Alar: 1.  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 Few gastric polyps on the greater curvature of the gastric body, 3. MILD Non-erosive gastritis. negative H.pylori  . Right bunionectomy    . SAVORY DILATION N/A 11/24/2013   Procedure: SAVORY DILATION;  Surgeon: Danie Binder, MD;  Location: [facility];  Service: Endoscopy;  Laterality: N/A;     Family History  Problem Relation Age of Onset  . Healthy Mother   . Healthy Father   . Colon cancer Neg Hx     Social History   Social History Narrative   Married for 28 years.Lives with husband and 43 year old son.[employer] employee.   Social History   Tobacco Use  . Smoking status: Never Smoker  . Smokeless tobacco: Never Used  Substance Use Topics  . Alcohol use: No    Current Meds  Medication Sig  .  amLODipine-benazepril (LOTREL) 10-20 MG capsule Take 1 capsule by mouth daily.  Marland Kitchen atorvastatin (LIPITOR) 10 MG tablet Take 1 tablet (10 mg total) by mouth daily.  . meclizine (ANTIVERT) 25 MG tablet Take 1 tablet (25 mg total) by mouth 3 (three) times daily as needed for dizziness.      No flowsheet data found.   Objective:   Today's Vitals: BP 136/80 (BP Location: Right Arm, Patient Position: Sitting, Cuff Size: Normal)   Pulse 78   Temp (!) 97.4 F (36.3 C) (Temporal)   Resp 18   Ht 5\' 6"  (1.676 m)   Wt 184 lb (83.5 kg)   SpO2 97%   BMI 29.70 kg/m  Vitals with BMI 01/10/2020 12/18/2019 09/13/2019  Height 5\' 6"  - -  Weight 184 lbs - -  BMI 78.29 - -  Systolic 562 130 865  Diastolic 80 74 83  Pulse 78 95 78     Physical Exam  She looks systemically well.  She is overweight.  Blood pressure borderline elevated.  Alert and orientated without any focal neurological signs     Assessment   1. Essential hypertension, benign   2. Mixed hyperlipidemia   3. Overweight (BMI 25.0-29.9)       Tests ordered No orders of the defined types were placed in this encounter.    Plan: 1. She will continue her Lotrel for hypertension. 2. I have requested that she discontinue  atorvastatin because I think that we can manage to improve her cholesterol with diet/nutrition.  I had a long discussion with her regarding nutrition and the concept of intermittent fasting combined with a plant-based diet.  I stressed the importance of hydration especially when she is fasting.  She continues to exercise 3 times a week with Zumba and she will continue with this. 3. I will see her in a few weeks time to see how she is doing and we will check all the blood work then.   No orders of the defined types were placed in this encounter.   Doree Albee, MD

## 2020-01-10 NOTE — Patient Instructions (Signed)
[facility] Dietary Recommendations for Weight Loss What to Avoid . Avoid added sugars o Often added sugar can be found in processed foods such as many condiments, dry cereals, cakes, cookies, chips, crisps, crackers, candies, sweetened drinks, etc.  o Read labels and AVOID/DECREASE use of foods with the following in their ingredient list: Sugar, fructose, high fructose corn syrup, sucrose, glucose, maltose, dextrose, molasses, cane sugar, brown sugar, any type of syrup, agave nectar, etc.   . Avoid snacking in between meals . Avoid foods made with flour o If you are going to eat food made with flour, choose those made with whole-grains; and, minimize your consumption as much as is tolerable . Avoid processed foods o These foods are generally stocked in the middle of the grocery store. Focus on shopping on the perimeter of the grocery.  . Avoid Meat  o We recommend following a plant-based diet at [facility]. Thus, we recommend avoiding meat as a general rule. Consider eating beans, legumes, eggs, and/or dairy products for regular protein sources o If you plan on eating meat limit to 4 ounces of meat at a time and choose lean options such as Fish, chicken, turkey. Avoid red meat intake such as pork and/or steak What to Include . Vegetables o GREEN LEAFY VEGETABLES: Kale, spinach, mustard greens, collard greens, cabbage, broccoli, etc. o OTHER: Asparagus, cauliflower, eggplant, carrots, peas, Brussel sprouts, tomatoes, bell peppers, zucchini, beets, cucumbers, etc. . Grains, seeds, and legumes o Beans: kidney beans, black eyed peas, garbanzo beans, black beans, pinto beans, etc. o Whole, unrefined grains: brown rice, barley, bulgur, oatmeal, etc. . Healthy fats  o Avoid highly processed fats such as vegetable oil o Examples of healthy fats: avocado, olives, virgin olive oil, dark chocolate (?72% Cocoa), nuts (peanuts, almonds, walnuts, cashews, pecans, etc.) . None to Low  Intake of Animal Sources of Protein o Meat sources: chicken, turkey, salmon, tuna. Limit to 4 ounces of meat at one time. o Consider limiting dairy sources, but when choosing dairy focus on: PLAIN Greek yogurt, cottage cheese, high-protein milk . Fruit o Choose berries  When to Eat . Intermittent Fasting: o Choosing not to eat for a specific time period, but DO FOCUS ON HYDRATION when fasting o Multiple Techniques: - Time Restricted Eating: eat 3 meals in a day, each meal lasting no more than 60 minutes, no snacks between meals - 16-18 hour fast: fast for 16 to 18 hours up to 7 days a week. Often suggested to start with 2-3 nonconsecutive days per week.  . Remember the time you sleep is counted as fasting.  . Examples of eating schedule: Fast from 7:00pm-11:00am. Eat between 11:00am-7:00pm.  - 24-hour fast: fast for 24 hours up to every other day. Often suggested to start with 1 day per week . Remember the time you sleep is counted as fasting . Examples of eating schedule:  o Eating day: eat 2-3 meals on your eating day. If doing 2 meals, each meal should last no more than 90 minutes. If doing 3 meals, each meal should last no more than 60 minutes. Finish last meal by 7:00pm. o Fasting day: Fast until 7:00pm.  o IF YOU FEEL UNWELL FOR ANY REASON/IN ANY WAY WHEN FASTING, STOP FASTING BY EATING A NUTRITIOUS SNACK OR LIGHT MEAL o ALWAYS FOCUS ON HYDRATION DURING FASTS - Acceptable Hydration sources: water, broths, tea/coffee (black tea/coffee is best but using a small amount of whole-fat dairy products in coffee/tea is acceptable).  -   Poor Hydration Sources: anything with sugar or artificial sweeteners added to it  These recommendations have been developed for patients that are actively receiving medical care from either Dr. Krist Rosenboom or Sarah Gray, DNP, NP-C at [facility]. These recommendations are developed for patients with specific medical conditions and are not meant to be  distributed or used by others that are not actively receiving care from either provider listed above at [facility]. It is not appropriate to participate in the above eating plans without proper medical supervision.   Reference: Fung, J. The obesity code. [place]: [place]; 2016.   

## 2020-01-18 DIAGNOSIS — H1031 Unspecified acute conjunctivitis, right eye: Secondary | ICD-10-CM | POA: Diagnosis not present

## 2020-02-14 ENCOUNTER — Ambulatory Visit (INDEPENDENT_AMBULATORY_CARE_PROVIDER_SITE_OTHER): Payer: BC Managed Care – PPO | Admitting: Internal Medicine

## 2020-02-15 ENCOUNTER — Other Ambulatory Visit: Payer: Self-pay

## 2020-02-15 ENCOUNTER — Ambulatory Visit (INDEPENDENT_AMBULATORY_CARE_PROVIDER_SITE_OTHER): Payer: BC Managed Care – PPO | Admitting: Internal Medicine

## 2020-02-15 ENCOUNTER — Encounter (INDEPENDENT_AMBULATORY_CARE_PROVIDER_SITE_OTHER): Payer: Self-pay | Admitting: Internal Medicine

## 2020-02-15 VITALS — BP 110/82 | HR 75 | Temp 98.4°F | Resp 18 | Ht 66.0 in | Wt 177.0 lb

## 2020-02-15 DIAGNOSIS — Z131 Encounter for screening for diabetes mellitus: Secondary | ICD-10-CM

## 2020-02-15 DIAGNOSIS — R5381 Other malaise: Secondary | ICD-10-CM | POA: Diagnosis not present

## 2020-02-15 DIAGNOSIS — R5383 Other fatigue: Secondary | ICD-10-CM | POA: Diagnosis not present

## 2020-02-15 DIAGNOSIS — I10 Essential (primary) hypertension: Secondary | ICD-10-CM

## 2020-02-15 DIAGNOSIS — E782 Mixed hyperlipidemia: Secondary | ICD-10-CM | POA: Diagnosis not present

## 2020-02-15 DIAGNOSIS — Z13820 Encounter for screening for osteoporosis: Secondary | ICD-10-CM

## 2020-02-15 DIAGNOSIS — E559 Vitamin D deficiency, unspecified: Secondary | ICD-10-CM

## 2020-02-15 NOTE — Progress Notes (Signed)
Metrics: Intervention Frequency ACO  Documented Smoking Status Yearly  Screened one or more times in 24 months  Cessation Counseling or  Active cessation medication Past 24 months  Past 24 months   Guideline developer: UpToDate (See UpToDate for funding source) Date Released: 2014       Wellness Office Visit  Subjective:  Patient ID: [facility], female    DOB: [birth year]  Age: 59 y.o. MRN: [id number]  CC: This lady comes in for follow-up of hypertension, hyperlipidemia. HPI  When I had met her the last time which was the first time to establish care, I recommended that she continue statin therapy and we started discussing nutrition and she has followed my recommendations.  As a result, she has lost weight and feels much happier about this.  She feels well overall. She continues on Lotrel for hypertension. Past Medical History:  Diagnosis Date  . Hyperlipidemia   . Hypertension   . Vertigo    Past Surgical History:  Procedure Laterality Date  . CESAREAN SECTION    . COLONOSCOPY N/A 07/31/2013   WIO:XBDZHG mucosa in the terminal ileum/Mild diverticulosis in the sigmoid colon/Moderate sized internal hemorrhoids  . Cyst removed from left foot    . ESOPHAGOGASTRODUODENOSCOPY N/A 11/24/2013   Dr. Oneida Alar: 1.  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 Few gastric polyps on the greater curvature of the gastric body, 3. MILD Non-erosive gastritis. negative H.pylori  . Right bunionectomy    . SAVORY DILATION N/A 11/24/2013   Procedure: SAVORY DILATION;  Surgeon: Danie Binder, MD;  Location: [facility];  Service: Endoscopy;  Laterality: N/A;     Family History  Problem Relation Age of Onset  . Healthy Mother   . Healthy Father   . Colon cancer Neg Hx     Social History   Social History Narrative   Married for 28 years.Lives with husband and 50 year old son.[employer] employee.   Social History   Tobacco Use  . Smoking status: Never Smoker  . Smokeless  tobacco: Never Used  Substance Use Topics  . Alcohol use: No    Current Meds  Medication Sig  . amLODipine-benazepril (LOTREL) 10-20 MG capsule Take 1 capsule by mouth daily.  . [DISCONTINUED] atorvastatin (LIPITOR) 10 MG tablet Take 1 tablet (10 mg total) by mouth daily.  . [DISCONTINUED] meclizine (ANTIVERT) 25 MG tablet Take 1 tablet (25 mg total) by mouth 3 (three) times daily as needed for dizziness.      No flowsheet data found.   Objective:   Today's Vitals: BP 110/82   Pulse 75   Temp 98.4 F (36.9 C) (Temporal)   Resp 18   Ht _0  (1.676 m)   Wt 177 lb (80.3 kg)   LMP  (Within Years) Comment: Age 54 yrs  SpO2 98%   BMI 28.57 kg/m  Vitals with BMI 02/15/2020 01/10/2020 12/18/2019  Height _1  _2  -  Weight 177 lbs 184 lbs -  BMI 99.24 26.83 -  Systolic 419 622 297  Diastolic 82 80 74  Pulse 75 78 95     Physical Exam   She looks systemically well.  She has lost 7 pounds since the last visit.  Her blood pressure has also improved systolically.    Assessment   1. Essential hypertension, benign   2. Mixed hyperlipidemia   3. Malaise and fatigue   4. Vitamin D deficiency disease   5. Screening for diabetes mellitus   6.  Osteoporosis screening       Tests ordered Orders Placed This Encounter  Procedures  . DG Bone Density  . CBC  . COMPLETE METABOLIC PANEL WITH GFR  . Hemoglobin A1c  . Lipid panel  . T3, free  . T4  . TSH  . VITAMIN D 25 Hydroxy (Vit-D Deficiency, Fractures)     Plan: 1. She will continue with Lotrel for hypertension for the time being but I would hope that we would be able to eliminate this eventually. 2. I will check all the blood work including her lipid panel today. 3. I will also arrange for her to have a bone density scan as she is postmenopausal. 4. I will see her in 3 months time for an annual physical exam and further recommendations will depend on blood results.   No orders of the defined types were placed  in this encounter.   Doree Albee, MD

## 2020-02-16 LAB — CBC
HCT: 40.5 % (ref 35.0–45.0)
Hemoglobin: 13.6 g/dL (ref 11.7–15.5)
MCH: 28.8 pg (ref 27.0–33.0)
MCHC: 33.6 g/dL (ref 32.0–36.0)
MCV: 85.6 fL (ref 80.0–100.0)
MPV: 9.9 fL (ref 7.5–12.5)
Platelets: 330 10*3/uL (ref 140–400)
RBC: 4.73 10*6/uL (ref 3.80–5.10)
RDW: 12.8 % (ref 11.0–15.0)
WBC: 6.1 10*3/uL (ref 3.8–10.8)

## 2020-02-16 LAB — LIPID PANEL
Cholesterol: 236 mg/dL — ABNORMAL HIGH (ref <200)
HDL: 47 mg/dL — ABNORMAL LOW (ref >=50)
LDL Cholesterol (Calc): 161 mg/dL (calc) — ABNORMAL HIGH
Non-HDL Cholesterol (Calc): 189 mg/dL (calc) — ABNORMAL HIGH (ref <130)
Total CHOL/HDL Ratio: 5 (calc) — ABNORMAL HIGH (ref <5.0)
Triglycerides: 146 mg/dL (ref <150)

## 2020-02-16 LAB — COMPLETE METABOLIC PANEL WITH GFR
AG Ratio: 1.4 (calc) (ref 1.0–2.5)
ALT: 21 U/L (ref 6–29)
AST: 16 U/L (ref 10–35)
Albumin: 4.5 g/dL (ref 3.6–5.1)
Alkaline phosphatase (APISO): 75 U/L (ref 37–153)
BUN: 10 mg/dL (ref 7–25)
CO2: 31 mmol/L (ref 20–32)
Calcium: 9.5 mg/dL (ref 8.6–10.4)
Chloride: 104 mmol/L (ref 98–110)
Creat: 0.87 mg/dL (ref 0.50–1.05)
GFR, Est African American: 86 mL/min/{1.73_m2} (ref >=60)
GFR, Est Non African American: 74 mL/min/{1.73_m2} (ref >=60)
Globulin: 3.2 g/dL (calc) (ref 1.9–3.7)
Glucose, Bld: 91 mg/dL (ref 65–99)
Potassium: 4 mmol/L (ref 3.5–5.3)
Sodium: 144 mmol/L (ref 135–146)
Total Bilirubin: 0.5 mg/dL (ref 0.2–1.2)
Total Protein: 7.7 g/dL (ref 6.1–8.1)

## 2020-02-16 LAB — HEMOGLOBIN A1C
Hgb A1c MFr Bld: 6 % of total Hgb — ABNORMAL HIGH (ref <5.7)
Mean Plasma Glucose: 126 (calc)
eAG (mmol/L): 7 (calc)

## 2020-02-16 LAB — VITAMIN D 25 HYDROXY (VIT D DEFICIENCY, FRACTURES): Vit D, 25-Hydroxy: 11 ng/mL — ABNORMAL LOW (ref 30–100)

## 2020-02-16 LAB — T3, FREE: T3, Free: 3.2 pg/mL (ref 2.3–4.2)

## 2020-02-16 LAB — TSH: TSH: 1.18 mIU/L (ref 0.40–4.50)

## 2020-02-16 LAB — T4: T4, Total: 9.1 ug/dL (ref 5.1–11.9)

## 2020-03-17 DIAGNOSIS — Z20828 Contact with and (suspected) exposure to other viral communicable diseases: Secondary | ICD-10-CM | POA: Diagnosis not present

## 2020-03-19 DIAGNOSIS — Z20822 Contact with and (suspected) exposure to covid-19: Secondary | ICD-10-CM | POA: Diagnosis not present

## 2020-03-28 ENCOUNTER — Telehealth (INDEPENDENT_AMBULATORY_CARE_PROVIDER_SITE_OTHER): Payer: Self-pay | Admitting: Internal Medicine

## 2020-03-28 ENCOUNTER — Other Ambulatory Visit (INDEPENDENT_AMBULATORY_CARE_PROVIDER_SITE_OTHER): Payer: Self-pay | Admitting: Internal Medicine

## 2020-03-28 DIAGNOSIS — Z20828 Contact with and (suspected) exposure to other viral communicable diseases: Secondary | ICD-10-CM | POA: Diagnosis not present

## 2020-03-28 MED ORDER — AMLODIPINE BESY-BENAZEPRIL HCL 10-20 MG PO CAPS: 1.0000 | ORAL_CAPSULE | Freq: Every day | ORAL | 2 refills | Status: DC

## 2020-04-02 NOTE — Telephone Encounter (Signed)
done

## 2020-04-30 DIAGNOSIS — Z6828 Body mass index (BMI) 28.0-28.9, adult: Secondary | ICD-10-CM | POA: Diagnosis not present

## 2020-04-30 DIAGNOSIS — Z1231 Encounter for screening mammogram for malignant neoplasm of breast: Secondary | ICD-10-CM | POA: Diagnosis not present

## 2020-04-30 DIAGNOSIS — R3121 Asymptomatic microscopic hematuria: Secondary | ICD-10-CM | POA: Diagnosis not present

## 2020-04-30 DIAGNOSIS — Z01419 Encounter for gynecological examination (general) (routine) without abnormal findings: Secondary | ICD-10-CM | POA: Diagnosis not present

## 2020-04-30 DIAGNOSIS — Z1389 Encounter for screening for other disorder: Secondary | ICD-10-CM | POA: Diagnosis not present

## 2020-04-30 DIAGNOSIS — Z130 Encounter for screening for diseases of the blood and blood-forming organs and certain disorders involving the immune mechanism: Secondary | ICD-10-CM | POA: Diagnosis not present

## 2020-05-20 ENCOUNTER — Ambulatory Visit (INDEPENDENT_AMBULATORY_CARE_PROVIDER_SITE_OTHER): Payer: BC Managed Care – PPO | Admitting: Internal Medicine

## 2020-05-20 ENCOUNTER — Encounter (INDEPENDENT_AMBULATORY_CARE_PROVIDER_SITE_OTHER): Payer: Self-pay | Admitting: Internal Medicine

## 2020-05-20 ENCOUNTER — Other Ambulatory Visit: Payer: Self-pay

## 2020-05-20 VITALS — BP 120/82 | HR 79 | Temp 97.5°F | Resp 18 | Ht 66.0 in | Wt 172.5 lb

## 2020-05-20 DIAGNOSIS — I10 Essential (primary) hypertension: Secondary | ICD-10-CM

## 2020-05-20 DIAGNOSIS — E782 Mixed hyperlipidemia: Secondary | ICD-10-CM

## 2020-05-20 DIAGNOSIS — Z13820 Encounter for screening for osteoporosis: Secondary | ICD-10-CM

## 2020-05-20 DIAGNOSIS — R7303 Prediabetes: Secondary | ICD-10-CM | POA: Diagnosis not present

## 2020-05-20 DIAGNOSIS — Z0001 Encounter for general adult medical examination with abnormal findings: Secondary | ICD-10-CM | POA: Diagnosis not present

## 2020-05-20 NOTE — Patient Instructions (Signed)
VITAMIN D3 10,000 UNITS/DAY

## 2020-05-20 NOTE — Progress Notes (Signed)
Chief Complaint: This 59 old lady comes in for an annual physical exam. HPI: She has no specific complaints.  In fact, she feels well.  She has lost weight based on discussions we had last visit on nutrition. She continues on Lotrel for her hypertension. She has discontinued statin therapy. She is a prediabetic and hopefully this is improved also based on her dietary modifications that we discussed last time.  Past Medical History:  Diagnosis Date  . Hyperlipidemia   . Hypertension   . Vertigo    Past Surgical History:  Procedure Laterality Date  . CESAREAN SECTION    . COLONOSCOPY N/A 07/31/2013   HWE:XHBZJI mucosa in the terminal ileum/Mild diverticulosis in the sigmoid colon/Moderate sized internal hemorrhoids  . Cyst removed from left foot    . ESOPHAGOGASTRODUODENOSCOPY N/A 11/24/2013   Dr. Oneida Alar: 1.  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 Few gastric polyps on the greater curvature of the gastric body, 3. MILD Non-erosive gastritis. negative H.pylori  . Right bunionectomy    . SAVORY DILATION N/A 11/24/2013   Procedure: SAVORY DILATION;  Surgeon: Danie Binder, MD;  Location: [facility];  Service: Endoscopy;  Laterality: N/A;     Social History   Social History Narrative   Married for 28 years.Lives with husband and 33 year old son.[employer] employee-works in Press photographer.    Social History   Tobacco Use  . Smoking status: Never Smoker  . Smokeless tobacco: Never Used  Substance Use Topics  . Alcohol use: No      Allergies: No Known Allergies   Current Meds  Medication Sig  . amLODipine-benazepril (LOTREL) 10-20 MG capsule Take 1 capsule by mouth daily.       Depression screen PHQ 2/9 05/20/2020  Decreased Interest 0  Down, Depressed, Hopeless 0  PHQ - 2 Score 0     RCV:ELFYB from the symptoms mentioned above,there are no other symptoms referable to all systems reviewed.       Physical Exam: Blood pressure 120/82, pulse 79,  temperature (!) 97.5 F (36.4 C), temperature source Temporal, resp. rate 18, height 5\' 6"  (1.676 m), weight 172 lb 8 oz (78.2 kg), SpO2 97 %. Vitals with BMI 05/20/2020 02/15/2020 01/10/2020  Height 5\' 6"  5\' 6"  5\' 6"   Weight 172 lbs 8 oz 177 lbs 184 lbs  BMI 27.86 01.75 10.25  Systolic 852 778 242  Diastolic 82 82 80  Pulse 79 75 78      She looks systemically well.  Overweight.  Blood pressure in a good range. General: Alert, cooperative, and appears to be the stated age.No pallor.  No jaundice.  No clubbing. Head: Normocephalic Eyes: Sclera white, pupils equal and reactive to light, red reflex x 2,  Ears: Normal bilaterally Oral cavity: Lips, mucosa, and tongue normal: Teeth and gums normal Neck: No adenopathy, supple, symmetrical, trachea midline, and thyroid does not appear enlarged Respiratory: Clear to auscultation bilaterally.No wheezing, crackles or bronchial breathing. Cardiovascular: Heart sounds are present and appear to be normal without murmurs or added sounds.  No carotid bruits.  Peripheral pulses are present and equal bilaterally.: Gastrointestinal:positive bowel sounds, no hepatosplenomegaly.  No masses felt.No tenderness. Skin: Clear, No rashes noted.No worrisome skin lesions seen. Neurological: Grossly intact without focal findings, cranial nerves II through XII intact, muscle strength equal bilaterally Musculoskeletal: No acute joint abnormalities noted.Full range of movement noted with joints. Psychiatric: Affect appropriate, non-anxious.    Assessment  1. Encounter for general adult medical examination  with abnormal findings   2. Essential hypertension, benign   3. Mixed hyperlipidemia   4. Prediabetes   5. Osteoporosis screening     Tests Ordered:   Orders Placed This Encounter  Procedures  . DG Bone Density  . Lipid panel  . Hemoglobin A1c     Plan  1. I will order a bone density scan. 2. She will continue with Lotrel for hypertension. 3. I  did recommend that she start taking vitamin D3 10,000 units daily. 4. Further recommendations will depend on blood results and I will see her in 3 months for follow-up.  Today, I did introduce the subject of the identical hormone replacement therapy which she may benefit from.     No orders of the defined types were placed in this encounter.    Torion Hulgan C Rashon Westrup   05/20/2020, 3:36 PM

## 2020-05-23 ENCOUNTER — Other Ambulatory Visit (HOSPITAL_COMMUNITY): Payer: BC Managed Care – PPO

## 2020-05-30 DIAGNOSIS — L821 Other seborrheic keratosis: Secondary | ICD-10-CM | POA: Diagnosis not present

## 2020-05-30 DIAGNOSIS — L918 Other hypertrophic disorders of the skin: Secondary | ICD-10-CM | POA: Diagnosis not present

## 2020-06-02 DIAGNOSIS — Z20828 Contact with and (suspected) exposure to other viral communicable diseases: Secondary | ICD-10-CM | POA: Diagnosis not present

## 2020-06-04 DIAGNOSIS — Z03818 Encounter for observation for suspected exposure to other biological agents ruled out: Secondary | ICD-10-CM | POA: Diagnosis not present

## 2020-06-20 DIAGNOSIS — E782 Mixed hyperlipidemia: Secondary | ICD-10-CM | POA: Diagnosis not present

## 2020-06-20 DIAGNOSIS — R7303 Prediabetes: Secondary | ICD-10-CM | POA: Diagnosis not present

## 2020-06-21 LAB — LIPID PANEL
Cholesterol: 214 mg/dL — ABNORMAL HIGH (ref <200)
HDL: 55 mg/dL (ref >=50)
LDL Cholesterol (Calc): 144 mg/dL (calc) — ABNORMAL HIGH
Non-HDL Cholesterol (Calc): 159 mg/dL (calc) — ABNORMAL HIGH (ref <130)
Total CHOL/HDL Ratio: 3.9 (calc) (ref <5.0)
Triglycerides: 61 mg/dL (ref <150)

## 2020-06-21 LAB — HEMOGLOBIN A1C
Hgb A1c MFr Bld: 6 % of total Hgb — ABNORMAL HIGH (ref <5.7)
Mean Plasma Glucose: 126 mg/dL
eAG (mmol/L): 7 mmol/L

## 2020-06-22 ENCOUNTER — Other Ambulatory Visit (INDEPENDENT_AMBULATORY_CARE_PROVIDER_SITE_OTHER): Payer: Self-pay | Admitting: Internal Medicine

## 2020-07-23 DIAGNOSIS — R7309 Other abnormal glucose: Secondary | ICD-10-CM | POA: Diagnosis not present

## 2020-08-19 ENCOUNTER — Ambulatory Visit (INDEPENDENT_AMBULATORY_CARE_PROVIDER_SITE_OTHER): Payer: BC Managed Care – PPO | Admitting: Internal Medicine

## 2020-08-20 DIAGNOSIS — R7309 Other abnormal glucose: Secondary | ICD-10-CM | POA: Diagnosis not present

## 2020-09-02 ENCOUNTER — Encounter (INDEPENDENT_AMBULATORY_CARE_PROVIDER_SITE_OTHER): Payer: Self-pay | Admitting: Internal Medicine

## 2020-09-02 ENCOUNTER — Other Ambulatory Visit: Payer: Self-pay

## 2020-09-02 ENCOUNTER — Ambulatory Visit (INDEPENDENT_AMBULATORY_CARE_PROVIDER_SITE_OTHER): Payer: BC Managed Care – PPO | Admitting: Internal Medicine

## 2020-09-02 VITALS — BP 130/70 | HR 80 | Ht 66.0 in | Wt 178.8 lb

## 2020-09-02 DIAGNOSIS — E782 Mixed hyperlipidemia: Secondary | ICD-10-CM

## 2020-09-02 DIAGNOSIS — I10 Essential (primary) hypertension: Secondary | ICD-10-CM

## 2020-09-02 DIAGNOSIS — E559 Vitamin D deficiency, unspecified: Secondary | ICD-10-CM | POA: Diagnosis not present

## 2020-09-02 DIAGNOSIS — R7303 Prediabetes: Secondary | ICD-10-CM

## 2020-09-02 NOTE — Progress Notes (Signed)
Metrics: Intervention Frequency ACO  Documented Smoking Status Yearly  Screened one or more times in 24 months  Cessation Counseling or  Active cessation medication Past 24 months  Past 24 months   Guideline developer: UpToDate (See UpToDate for funding source) Date Released: 2014       Wellness Office Visit  Subjective:  Patient ID: [facility], female    DOB: [birth year]  Age: 60 y.o. MRN: [id number]  CC: This lady comes in for follow-up of hypertension, dyslipidemia and vitamin D deficiency. HPI  She continues to take Lotrel for hypertension and also vitamin D3 10,000 units daily for vitamin D deficiency. She has not been consistent with nutrition since the last time I saw her and as a result has gained some weight back. Past Medical History:  Diagnosis Date  . Hyperlipidemia   . Hypertension   . Vertigo    Past Surgical History:  Procedure Laterality Date  . CESAREAN SECTION    . COLONOSCOPY N/A 07/31/2013   EPP:IRJJOA mucosa in the terminal ileum/Mild diverticulosis in the sigmoid colon/Moderate sized internal hemorrhoids  . Cyst removed from left foot    . ESOPHAGOGASTRODUODENOSCOPY N/A 11/24/2013   Dr. Oneida Alar: 1.  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 Few gastric polyps on the greater curvature of the gastric body, 3. MILD Non-erosive gastritis. negative H.pylori  . Right bunionectomy    . SAVORY DILATION N/A 11/24/2013   Procedure: SAVORY DILATION;  Surgeon: Danie Binder, MD;  Location: [facility];  Service: Endoscopy;  Laterality: N/A;     Family History  Problem Relation Age of Onset  . Healthy Mother   . Healthy Father   . Colon cancer Neg Hx     Social History   Social History Narrative   Married for 28 years.Lives with husband and 16 year old son.[employer] employee-works in Press photographer.   Social History   Tobacco Use  . Smoking status: Never Smoker  . Smokeless tobacco: Never Used  Substance Use Topics  . Alcohol use: No     Current Meds  Medication Sig  . amLODipine-benazepril (LOTREL) 10-20 MG capsule TAKE 1 CAPSULE BY MOUTH EVERY DAY  . Cholecalciferol (VITAMIN D3) 125 MCG (5000 UT) TABS Take 2 tablets by mouth daily at 12 noon.       Objective:   Today's Vitals: BP 130/70   Pulse 80   Ht 5\' 6"  (1.676 m)   Wt 178 lb 12.8 oz (81.1 kg)   BMI 28.86 kg/m  Vitals with BMI 09/02/2020 05/20/2020 02/15/2020  Height 5\' 6"  5\' 6"  5\' 6"   Weight 178 lbs 13 oz 172 lbs 8 oz 177 lbs  BMI 28.87 41.66 06.30  Systolic 160 109 323  Diastolic 70 82 82  Pulse 80 79 75     Physical Exam  She looks systemically well, remains overweight.  She has gained 6 pounds since last visit.  Blood pressure is acceptable and under good control.  She is alert and orientated without any focal neurological signs.     Assessment   1. Mixed hyperlipidemia   2. Prediabetes   3. Essential hypertension, benign   4. Vitamin D deficiency disease       Tests ordered No orders of the defined types were placed in this encounter.    Plan: 1. She will continue with Lotrel for hypertension. 2. She will continue with vitamin D3 for vitamin D deficiency. 3. I reinforced the idea of intermittent fasting combined with a plant-based  diet, making sure she is well-hydrated and also if she goes for prolonged periods of fasting, to make sure that she has salt intake that is adequate. 4. Follow-up in about 2 months and see how she is doing and we will probably do blood work then.   No orders of the defined types were placed in this encounter.   Doree Albee, MD

## 2020-09-12 ENCOUNTER — Other Ambulatory Visit: Payer: Self-pay

## 2020-09-12 ENCOUNTER — Ambulatory Visit (HOSPITAL_COMMUNITY)
Admission: RE | Admit: 2020-09-12 | Discharge: 2020-09-12 | Disposition: A | Discharge status: Home / Self Care | Payer: BC Managed Care – PPO | Source: Ambulatory Visit | Attending: Internal Medicine | Admitting: Internal Medicine

## 2020-09-12 DIAGNOSIS — M8589 Other specified disorders of bone density and structure, multiple sites: Secondary | ICD-10-CM | POA: Diagnosis not present

## 2020-09-12 DIAGNOSIS — Z13820 Encounter for screening for osteoporosis: Secondary | ICD-10-CM | POA: Diagnosis not present

## 2020-09-12 DIAGNOSIS — Z780 Asymptomatic menopausal state: Secondary | ICD-10-CM | POA: Diagnosis not present

## 2020-09-17 ENCOUNTER — Other Ambulatory Visit (INDEPENDENT_AMBULATORY_CARE_PROVIDER_SITE_OTHER): Payer: Self-pay | Admitting: Internal Medicine

## 2020-09-20 DIAGNOSIS — R7309 Other abnormal glucose: Secondary | ICD-10-CM | POA: Diagnosis not present

## 2020-10-20 DIAGNOSIS — R7309 Other abnormal glucose: Secondary | ICD-10-CM | POA: Diagnosis not present

## 2020-11-01 ENCOUNTER — Encounter: Payer: Self-pay | Admitting: Emergency Medicine

## 2020-11-01 ENCOUNTER — Other Ambulatory Visit: Payer: Self-pay

## 2020-11-01 ENCOUNTER — Ambulatory Visit
Admission: EM | Admit: 2020-11-01 | Discharge: 2020-11-01 | Disposition: A | Discharge status: Home / Self Care | Payer: BC Managed Care – PPO | Attending: Emergency Medicine | Admitting: Emergency Medicine

## 2020-11-01 DIAGNOSIS — J0110 Acute frontal sinusitis, unspecified: Secondary | ICD-10-CM

## 2020-11-01 MED ORDER — AMOXICILLIN-POT CLAVULANATE 875-125 MG PO TABS: 1.0000 | ORAL_TABLET | Freq: Two times a day (BID) | ORAL | 0 refills | Status: AC

## 2020-11-01 NOTE — ED Triage Notes (Addendum)
Head pain, ear pain and throat pain all to to LT side since Sunday.  Neg at home covid test yesterday.  Pt does report she got some water in her ear on Saturday while in the shower.

## 2020-11-01 NOTE — Discharge Instructions (Addendum)
Rest and push fluids °Augmentin prescribed.  Take as directed and to completion °Continue with OTC ibuprofen/tylenol as needed for pain °Follow up with PCP if symptoms persists °Return or go to the ED if you have any new or worsening symptoms such as fever, chills, worsening sinus pain/pressure, cough, sore throat, chest pain, shortness of breath, abdominal pain, changes in bowel or bladder habits, etc...  °

## 2020-11-01 NOTE — ED Provider Notes (Signed)
[facility]   [id number] 11/01/20 Arrival Time: 7628   CC: Headache  SUBJECTIVE: History from: patient.  [facility] is a 60 y.o. female who presents with congestion, left sided facial pain/ sinus pain/ pressure, LT ear pain, LT throat pain x 5 days.  Denies sick exposure to COVID, flu or strep.  Has tried OTC medications without relief.  Symptoms are made worse at night.  Reports previous symptoms in the past.   Denies fever, chills, SOB, wheezing, chest pain, nausea, changes in bowel or bladder habits.    ROS: As per HPI.  All other pertinent ROS negative.     Past Medical History:  Diagnosis Date  . Hyperlipidemia   . Hypertension   . Vertigo    Past Surgical History:  Procedure Laterality Date  . CESAREAN SECTION    . COLONOSCOPY N/A 07/31/2013   BTD:VVOHYW mucosa in the terminal ileum/Mild diverticulosis in the sigmoid colon/Moderate sized internal hemorrhoids  . Cyst removed from left foot    . ESOPHAGOGASTRODUODENOSCOPY N/A 11/24/2013   Dr. Oneida Alar: 1.  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 Few gastric polyps on the greater curvature of the gastric body, 3. MILD Non-erosive gastritis. negative H.pylori  . Right bunionectomy    . SAVORY DILATION N/A 11/24/2013   Procedure: SAVORY DILATION;  Surgeon: Danie Binder, MD;  Location: [facility];  Service: Endoscopy;  Laterality: N/A;   No Known Allergies No current facility-administered medications on file prior to encounter.   Current Outpatient Medications on File Prior to Encounter  Medication Sig Dispense Refill  . amLODipine-benazepril (LOTREL) 10-20 MG capsule TAKE 1 CAPSULE BY MOUTH EVERY DAY 90 capsule 0  . Cholecalciferol (VITAMIN D3) 125 MCG (5000 UT) TABS Take 2 tablets by mouth daily at 12 noon.     Social History   Socioeconomic History  . Marital status: Married    Spouse name: Not on file  . Number of children: 2  . Years of education: Not on file  . Highest  education level: Not on file  Occupational History  . Not on file  Tobacco Use  . Smoking status: Never Smoker  . Smokeless tobacco: Never Used  Substance and Sexual Activity  . Alcohol use: No  . Drug use: No  . Sexual activity: Yes    Birth control/protection: None  Other Topics Concern  . Not on file  Social History Narrative   Married for 28 years.Lives with husband and 52 year old son.[employer] employee-works in Press photographer.   Social Determinants of Health   Financial Resource Strain: Not on file  Food Insecurity: Not on file  Transportation Needs: Not on file  Physical Activity: Not on file  Stress: Not on file  Social Connections: Not on file  Intimate Partner Violence: Not on file   Family History  Problem Relation Age of Onset  . Healthy Mother   . Healthy Father   . Colon cancer Neg Hx     OBJECTIVE:  Vitals:   11/01/20 0901  BP: 136/82  Pulse: 74  Resp: 17  Temp: 98.4 F (36.9 C)  TempSrc: Oral  SpO2: 96%     General appearance: alert; appears fatigued, but nontoxic; speaking in full sentences and tolerating own secretions HEENT: NCAT; Ears: EACs clear, TMs pearly gray; Eyes: PERRL.  EOM grossly intact. Nose: nares patent without rhinorrhea, Throat: oropharynx clear, tonsils non erythematous or enlarged, uvula midline  Neck: supple without LAD Lungs: unlabored respirations, symmetrical air entry;  cough: absent; no respiratory distress; CTAB Heart: regular rate and rhythm.  Skin: warm and dry Psychological: alert and cooperative; normal mood and affect   ASSESSMENT & PLAN:  1. Acute non-recurrent frontal sinusitis     Meds ordered this encounter  Medications  . amoxicillin-clavulanate (AUGMENTIN) 875-125 MG tablet    Sig: Take 1 tablet by mouth every 12 (twelve) hours for 10 days.    Dispense:  20 tablet    Refill:  0    Order Specific Question:   Supervising Provider    Answer:   Raylene Everts [[id number]]    Rest and push fluids Augmentin  prescribed.  Take as directed and to completion Continue with OTC ibuprofen/tylenol as needed for pain Follow up with PCP if symptoms persists Return or go to the ED if you have any new or worsening symptoms such as fever, chills, worsening sinus pain/pressure, cough, sore throat, chest pain, shortness of breath, abdominal pain, changes in bowel or bladder habits, etc...    Reviewed expectations re: course of current medical issues. Questions answered. Outlined signs and symptoms indicating need for more acute intervention. Patient verbalized understanding. After Visit Summary given.         Lestine Box, PA-C 11/01/20 [phone]

## 2020-11-04 ENCOUNTER — Ambulatory Visit (INDEPENDENT_AMBULATORY_CARE_PROVIDER_SITE_OTHER): Payer: BC Managed Care – PPO | Admitting: Internal Medicine

## 2020-11-20 DIAGNOSIS — R7309 Other abnormal glucose: Secondary | ICD-10-CM | POA: Diagnosis not present

## 2020-12-13 ENCOUNTER — Other Ambulatory Visit (INDEPENDENT_AMBULATORY_CARE_PROVIDER_SITE_OTHER): Payer: Self-pay | Admitting: Internal Medicine

## 2020-12-20 DIAGNOSIS — R7309 Other abnormal glucose: Secondary | ICD-10-CM | POA: Diagnosis not present

## 2020-12-23 IMAGING — US US THYROID
1 series · 13 of 25 positions shown · non-contrast
Comparison: None.

CLINICAL DATA: Palpable abnormality. Superior neck edema for the
past 5-6 months. No pain.

EXAM:
THYROID ULTRASOUND
TECHNIQUE: Ultrasound examination of the thyroid gland and adjacent soft
tissues was performed.

[Series 1: us thyroid · 13 of 50 slices shown]
[im 1/50]
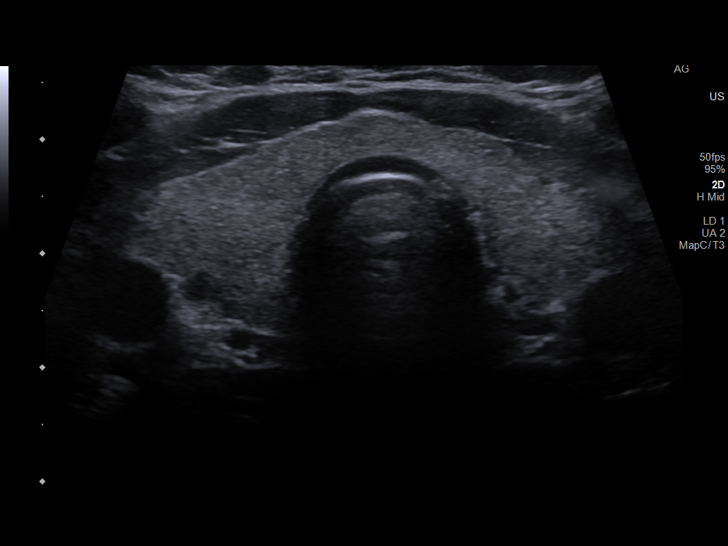
[im 5/50]
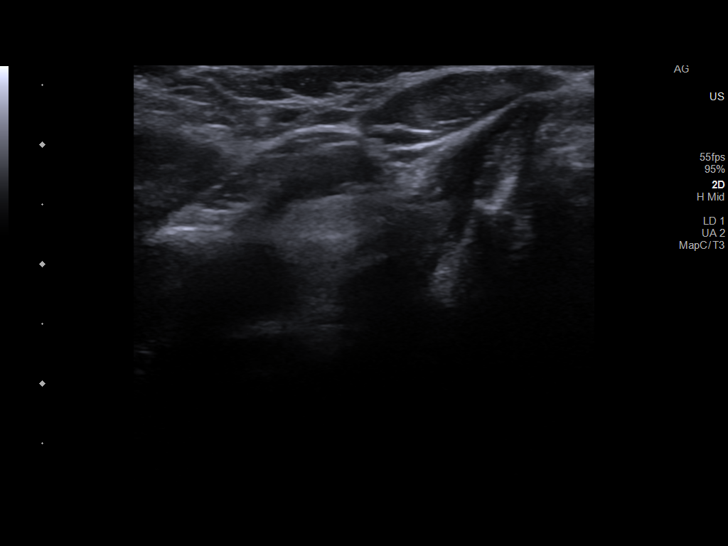
[im 9/50]
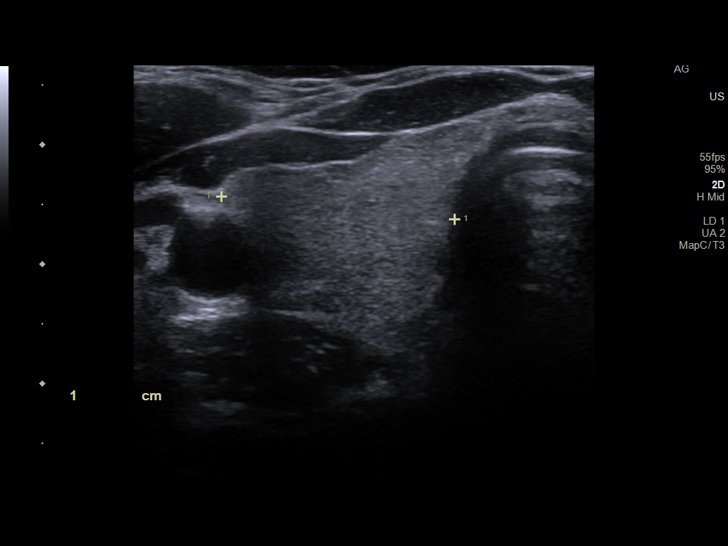
[im 13/50]
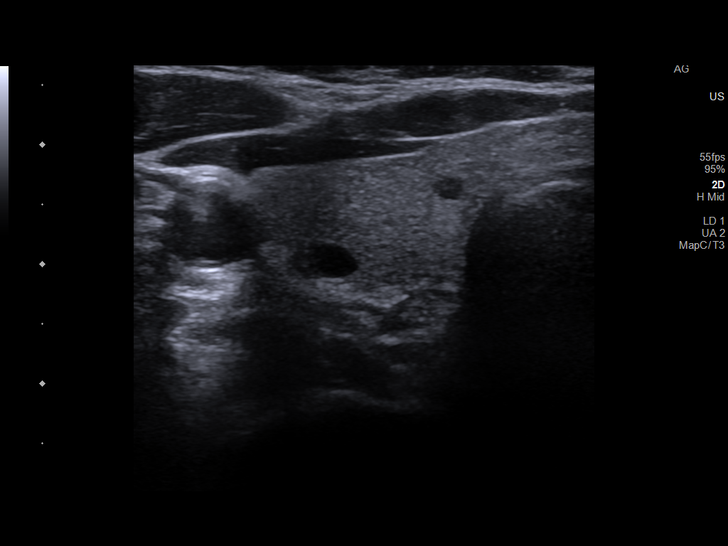
[im 17/50]
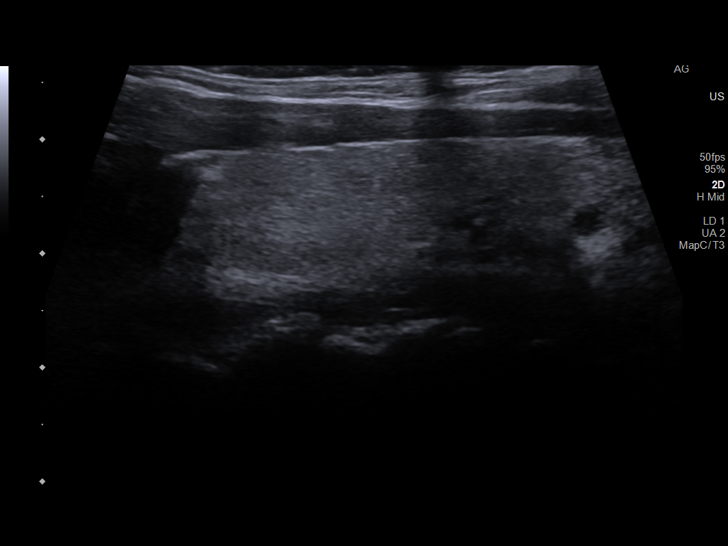
[im 21/50]
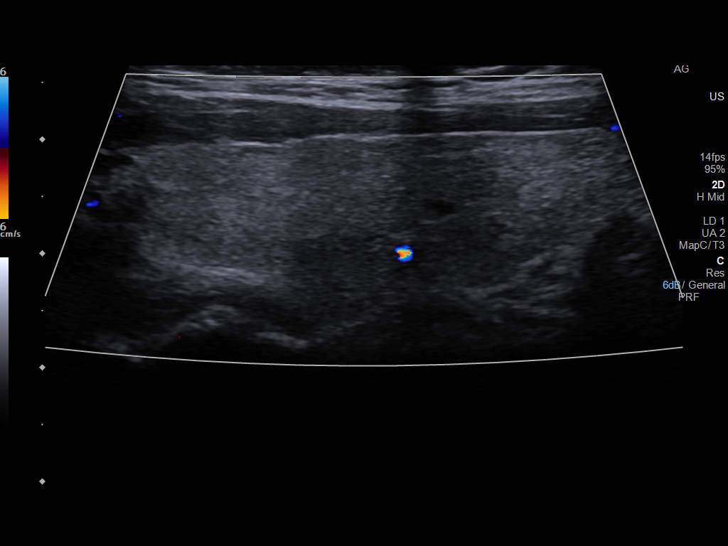
[im 25/50]
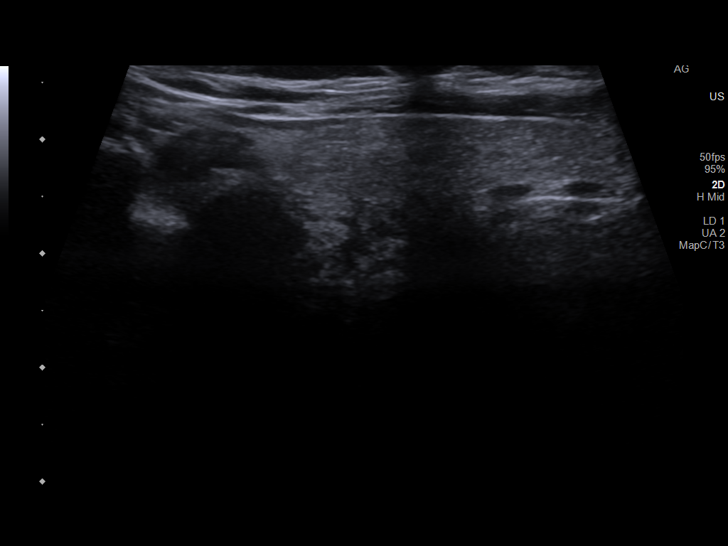
[im 29/50]
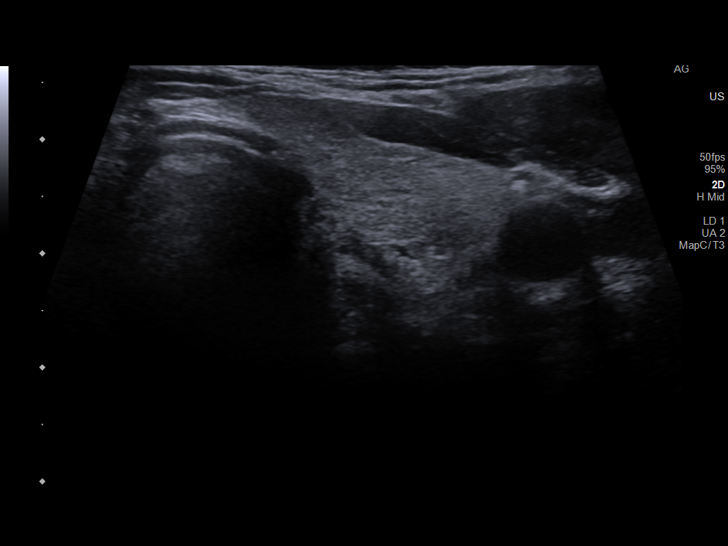
[im 33/50]
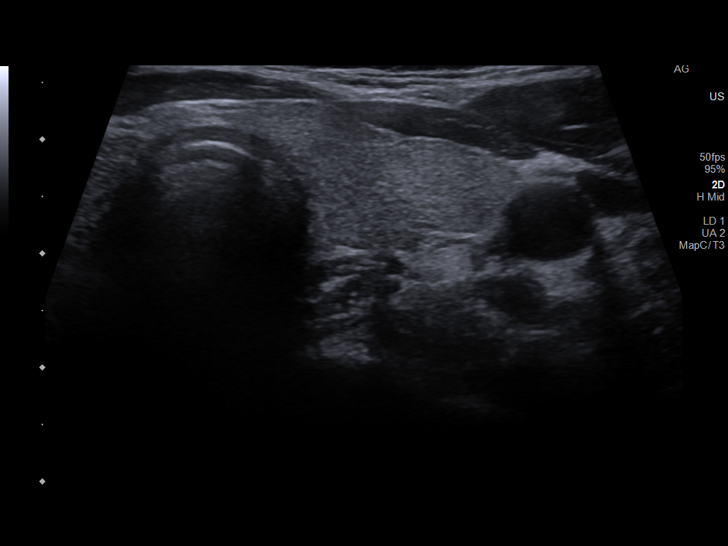
[im 37/50]
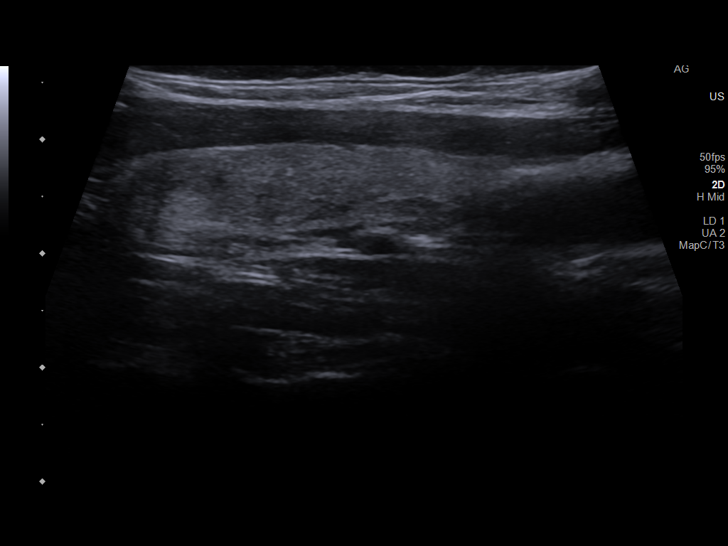
[im 41/50]
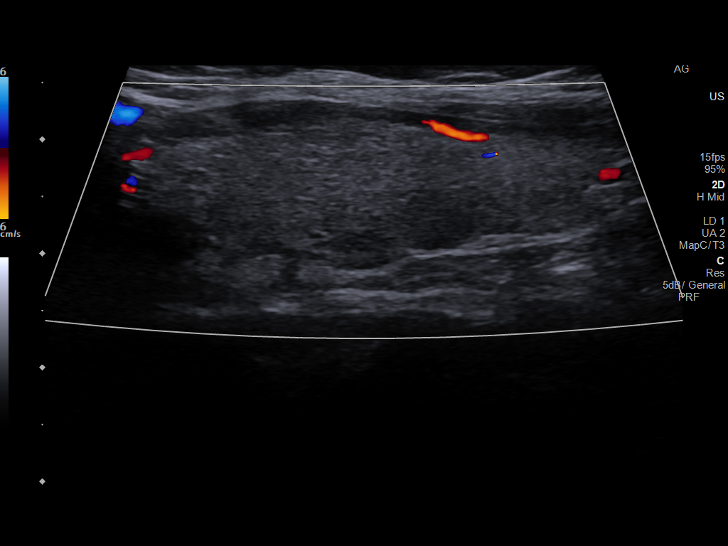
[im 45/50]
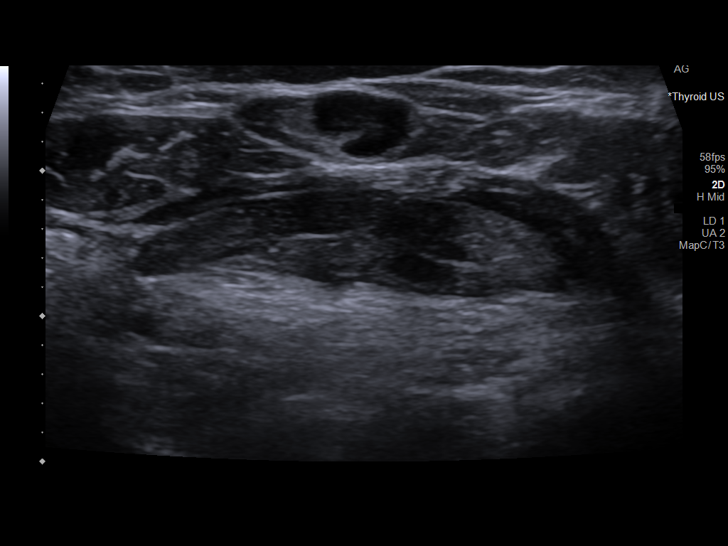
[im 50/50]
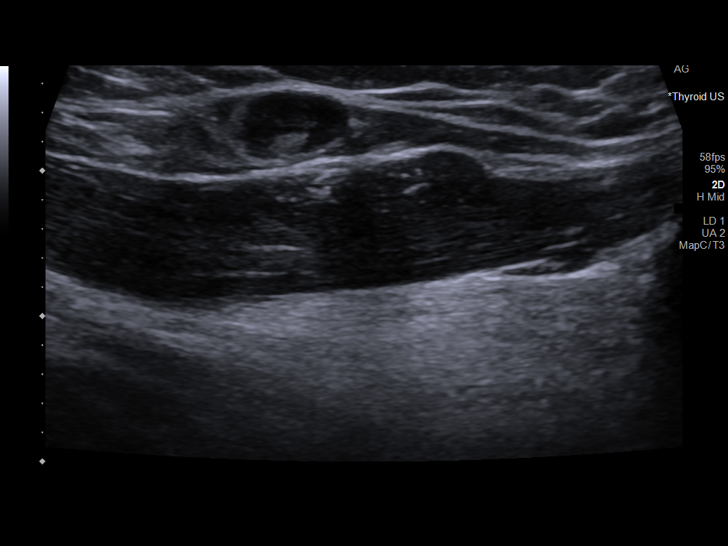

[13 of 25 positions shown; findings below may reference images not displayed]

FINDINGS: Parenchymal Echotexture: Mildly heterogenous

Isthmus: Normal in size measures 0.3 cm in diameter

Right lobe: Normal in size measuring 5.0 x 1.7 x 2.0 cm

Left lobe: Normal in size measuring 4.5 x 1.3 x 1.8 cm.

_________________________________________________________

Estimated total number of nodules >/= 1 cm: 0

Number of spongiform nodules >/=  2 cm not described below (TR1): 0

Number of mixed cystic and solid nodules >/= 1.5 cm not described
below (TR2): 0

_________________________________________________________

Scattered punctate (sub 4 mm) anechoic cysts within the isthmus and
right lobe of the thyroid, none of which meet imaging criteria to
recommend percutaneous sampling or continued dedicated follow-up.

Note is made of a benign appearing non pathologically enlarged
subcutaneous lymph nodes within the submandibular/submental region
of the midline of the neck. The lymph node is not enlarged by size
criteria, measuring 0.5 cm in diameter, and maintains a benign fatty
hilum (image 49).
IMPRESSION: 1. Mildly heterogeneous but normal sized thyroid gland without
discrete worrisome thyroid nodule or mass.
2. Isolated benign-appearing non pathologically enlarged
submandibular/submental subcutaneous lymph node, presumably reactive
in etiology.

The above is in keeping with the ACR TI-RADS recommendations - [HOSPITAL] 0338;[DATE].

## 2020-12-30 ENCOUNTER — Ambulatory Visit (INDEPENDENT_AMBULATORY_CARE_PROVIDER_SITE_OTHER): Payer: BC Managed Care – PPO | Admitting: Internal Medicine

## 2021-01-14 ENCOUNTER — Encounter (INDEPENDENT_AMBULATORY_CARE_PROVIDER_SITE_OTHER): Payer: Self-pay | Admitting: Internal Medicine

## 2021-01-14 ENCOUNTER — Other Ambulatory Visit: Payer: Self-pay

## 2021-01-14 ENCOUNTER — Ambulatory Visit (INDEPENDENT_AMBULATORY_CARE_PROVIDER_SITE_OTHER): Payer: BC Managed Care – PPO | Admitting: Internal Medicine

## 2021-01-14 VITALS — BP 126/84 | HR 87 | Temp 97.5°F | Resp 18 | Ht 66.0 in | Wt 181.4 lb

## 2021-01-14 DIAGNOSIS — E782 Mixed hyperlipidemia: Secondary | ICD-10-CM

## 2021-01-14 DIAGNOSIS — I10 Essential (primary) hypertension: Secondary | ICD-10-CM | POA: Diagnosis not present

## 2021-01-14 DIAGNOSIS — R7303 Prediabetes: Secondary | ICD-10-CM

## 2021-01-14 NOTE — Progress Notes (Signed)
Metrics: Intervention Frequency ACO  Documented Smoking Status Yearly  Screened one or more times in 24 months  Cessation Counseling or  Active cessation medication Past 24 months  Past 24 months   Guideline developer: UpToDate (See UpToDate for funding source) Date Released: 2014       Wellness Office Visit  Subjective:  Patient ID: Rachel Collier, female    DOB: [birth year]  Age: 60 y.o. MRN: [id number]  CC: This lady comes in for follow-up of hypertension, prediabetes, hyperlipidemia. HPI  3 weeks ago, her best friend of 56 years was in a car accident and died.  She has been very sad since this time but appears to be somewhat improved than 3 weeks ago.  She is not suicidal. She continues to take Lotrel for hypertension.  She denies any chest pain, dyspnea, palpitations or limb weakness. Past Medical History:  Diagnosis Date   Hyperlipidemia    Hypertension    Vertigo    Past Surgical History:  Procedure Laterality Date   CESAREAN SECTION     COLONOSCOPY N/A 07/31/2013   [id number] mucosa in the terminal ileum/Mild diverticulosis in the sigmoid colon/Moderate sized internal hemorrhoids   Cyst removed from left foot     ESOPHAGOGASTRODUODENOSCOPY N/A 11/24/2013   Dr. Oneida Alar: 1.  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 Few gastric polyps on the greater curvature of the gastric body, 3. MILD Non-erosive gastritis. 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   Social History   Tobacco Use   Smoking status: Never   Smokeless tobacco: Never  Substance  Use Topics   Alcohol use: No    Current Meds  Medication Sig   amLODipine-benazepril (LOTREL) 10-20 MG capsule TAKE 1 CAPSULE BY MOUTH EVERY DAY   Cholecalciferol (VITAMIN D3) 125 MCG (5000 UT) TABS Take 2 tablets by mouth daily at 12 noon.       Objective:   Today's Vitals: BP 126/84 (BP Location: Right Arm, Patient Position: Sitting, Cuff Size: Normal)   Pulse 87   Temp (!) 97.5 F (36.4 C) (Temporal)   Resp 18   Ht '5\' 6"'$  (1.676 m)   Wt 181 lb 6.4 oz (82.3 kg)   SpO2 97%   BMI 29.28 kg/m  Vitals with BMI 01/14/2021 11/01/2020 09/02/2020  Height '5\' 6"'$  - '5\' 6"'$   Weight 181 lbs 6 oz - 178 lbs 13 oz  BMI 123XX123 - 123XX123  Systolic [id number]  Diastolic 84 82 70  Pulse 87 74 80     Physical Exam Slightly tearful but appropriate.  Blood pressure is well controlled.  Alert and orientated without any focal logical signs.      Assessment   1. Essential hypertension, benign   2. Prediabetes   3. Mixed hyperlipidemia       Tests ordered No orders of the defined types were placed in this encounter.    Plan: 1.  Continue with Lotrel for hypertension. 2.  I will see her in early December for  an annual physical exam and we will check all the blood work then.    No orders of the defined types were placed in this encounter.   Doree Albee, MD
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
## 2021-01-20 DIAGNOSIS — R7309 Other abnormal glucose: Secondary | ICD-10-CM | POA: Diagnosis not present

## 2021-02-20 DIAGNOSIS — R7309 Other abnormal glucose: Secondary | ICD-10-CM | POA: Diagnosis not present

## 2021-03-03 ENCOUNTER — Telehealth: Payer: Self-pay

## 2021-03-03 NOTE — Telephone Encounter (Signed)
Patients mother talked to [facility] and said it was ok for her to schedule a New patient Appt.

## 2021-03-13 ENCOUNTER — Other Ambulatory Visit (INDEPENDENT_AMBULATORY_CARE_PROVIDER_SITE_OTHER): Payer: Self-pay | Admitting: Nurse Practitioner

## 2021-03-21 ENCOUNTER — Ambulatory Visit: Payer: BC Managed Care – PPO | Admitting: Family Medicine

## 2021-03-24 ENCOUNTER — Other Ambulatory Visit: Payer: Self-pay

## 2021-03-24 ENCOUNTER — Ambulatory Visit
Admission: EM | Admit: 2021-03-24 | Discharge: 2021-03-24 | Disposition: A | Discharge status: Home / Self Care | Payer: BC Managed Care – PPO | Attending: Family Medicine | Admitting: Family Medicine

## 2021-03-24 ENCOUNTER — Encounter: Payer: Self-pay | Admitting: Emergency Medicine

## 2021-03-24 DIAGNOSIS — R509 Fever, unspecified: Secondary | ICD-10-CM

## 2021-03-24 DIAGNOSIS — U071 COVID-19: Secondary | ICD-10-CM | POA: Diagnosis not present

## 2021-03-24 MED ORDER — PROMETHAZINE-DM 6.25-15 MG/5ML PO SYRP: 5.0000 mL | ORAL_SOLUTION | Freq: Four times a day (QID) | ORAL | 0 refills | Status: DC | PRN

## 2021-03-24 MED ORDER — NIRMATRELVIR/RITONAVIR (PAXLOVID)TABLET: 3.0000 | ORAL_TABLET | Freq: Two times a day (BID) | ORAL | 0 refills | Status: AC

## 2021-03-24 NOTE — ED Provider Notes (Signed)
[facility]    CSN: [id number] Arrival date & time: 03/24/21  1715      History   Chief Complaint No chief complaint on file.   HPI Rachel Collier is a 60 y.o. female.   Presenting today with 1 day history of sore throat, fever, chills, body aches, runny nose.  Denies chest pain, shortness of breath, abdominal pain, nausea vomiting or diarrhea.  Home COVID test was positive this morning.  She is taking Aleve with mild benefit.  No known chronic pulmonary conditions.  Requesting PCR test today and discussion regarding antiviral medications.   Past Medical History:  Diagnosis Date   Hyperlipidemia    Hypertension    Vertigo     Patient Active Problem List   Diagnosis Date Noted   Esophageal dysphagia 11/16/2013   GERD (gastroesophageal reflux disease) 11/16/2013   Abdominal pain, right upper quadrant 11/16/2013    Past Surgical History:  Procedure Laterality Date   CESAREAN SECTION     COLONOSCOPY N/A 07/31/2013   [id number] mucosa in the terminal ileum/Mild diverticulosis in the sigmoid colon/Moderate sized internal hemorrhoids   Cyst removed from left foot     ESOPHAGOGASTRODUODENOSCOPY N/A 11/24/2013   Dr. Oneida Alar: 1.  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 Few gastric polyps on the greater curvature of the gastric body, 3. MILD Non-erosive gastritis. negative H.pylori   Right bunionectomy     SAVORY DILATION N/A 11/24/2013   Procedure: SAVORY DILATION;  Surgeon: Danie Binder, MD;  Location: [facility];  Service: Endoscopy;  Laterality: N/A;    OB History   No obstetric history on file.      Home Medications    Prior to Admission medications   Medication Sig Start Date End Date Taking? Authorizing Provider  nirmatrelvir/ritonavir EUA (PAXLOVID) 20 x 150 MG & 10 x 100MG  TABS Take 3 tablets by mouth 2 (two) times daily for 5 days. Patient GFR is 86. Take nirmatrelvir (150 mg) two tablets twice daily for 5 days and ritonavir  (100 mg) one tablet twice daily for 5 days. 03/24/21 03/29/21 Yes Volney American, PA-C  promethazine-dextromethorphan (PROMETHAZINE-DM) 6.25-15 MG/5ML syrup Take 5 mLs by mouth 4 (four) times daily as needed for cough. 03/24/21  Yes Volney American, PA-C  amLODipine-benazepril (LOTREL) 10-20 MG capsule Take 1 capsule by mouth daily. No more refills will be approved by this provider as office is now permanently closed, patient will need to find another primary care provider. 03/14/21   Ailene Ards, NP  Cholecalciferol (VITAMIN D3) 125 MCG (5000 UT) TABS Take 2 tablets by mouth daily at 12 noon.    [provider]    Family History Family History  Problem Relation Age of Onset   Healthy Mother    Healthy Father    Colon cancer Neg Hx     Social History Social History   Tobacco Use   Smoking status: Never   Smokeless tobacco: Never  Substance Use Topics   Alcohol use: No   Drug use: No     Allergies   Patient has no known allergies.   Review of Systems Review of Systems Per HPI  Physical Exam Triage Vital Signs ED Triage Vitals [03/24/21 1855]  Enc Vitals Group     BP (!) 145/90     Pulse Rate 80     Resp 19     Temp 99 F (37.2 C)     Temp Source Oral  SpO2 95 %     Weight      Height      Head Circumference      Peak Flow      Pain Score 0     Pain Loc      Pain Edu?      Excl. in [place]?    No data found.  Updated Vital Signs BP (!) 145/90 (BP Location: Right Arm)   Pulse 80   Temp 99 F (37.2 C) (Oral)   Resp 19   SpO2 95%   Visual Acuity Right Eye Distance:   Left Eye Distance:   Bilateral Distance:    Right Eye Near:   Left Eye Near:    Bilateral Near:     Physical Exam Vitals and nursing note reviewed.  Constitutional:      Appearance: Normal appearance. She is not ill-appearing.  HENT:     Head: Atraumatic.     Right Ear: Tympanic membrane normal.     Left Ear: Tympanic membrane normal.     Nose: Rhinorrhea  present.     Mouth/Throat:     Mouth: Mucous membranes are moist.     Pharynx: Oropharynx is clear. Posterior oropharyngeal erythema present.  Eyes:     Extraocular Movements: Extraocular movements intact.     Conjunctiva/sclera: Conjunctivae normal.  Cardiovascular:     Rate and Rhythm: Normal rate and regular rhythm.     Heart sounds: Normal heart sounds.  Pulmonary:     Effort: Pulmonary effort is normal. No respiratory distress.     Breath sounds: Normal breath sounds. No wheezing or rales.  Musculoskeletal:        General: Normal range of motion.     Cervical back: Normal range of motion and neck supple.  Skin:    General: Skin is warm and dry.  Neurological:     Mental Status: She is alert and oriented to person, place, and time.  Psychiatric:        Mood and Affect: Mood normal.        Thought Content: Thought content normal.        Judgment: Judgment normal.     UC Treatments / Results  Labs (all labs ordered are listed, but only abnormal results are displayed) Labs Reviewed  NOVEL CORONAVIRUS, NAA    EKG   Radiology No results found.  Procedures Procedures (including critical care time)  Medications Ordered in UC Medications - No data to display  Initial Impression / Assessment and Plan / UC Course  I have reviewed the triage vital signs and the nursing notes.  Pertinent labs & imaging results that were available during my care of the patient were reviewed by me and considered in my medical decision making (see chart for details).     Vitals and exam overall reassuring, COVID test at home was positive but patient requesting repeat PCR test so this test is pending.  Discussion was had regarding PACs livid risks and benefits, patient wishing to proceed with the medication.  Phenergan DM also sent for symptomatic benefit and discussed blood pressure safe over-the-counter medications as well.  Return for acutely worsening symptoms.  Final Clinical  Impressions(s) / UC Diagnoses   Final diagnoses:  Fever, unspecified  COVID-19   Discharge Instructions   None    ED Prescriptions     Medication Sig Dispense Auth. Provider   nirmatrelvir/ritonavir EUA (PAXLOVID) 20 x 150 MG & 10 x 100MG  TABS Take 3 tablets by  mouth 2 (two) times daily for 5 days. Patient GFR is 86. Take nirmatrelvir (150 mg) two tablets twice daily for 5 days and ritonavir (100 mg) one tablet twice daily for 5 days. 30 tablet Volney American, [place]   promethazine-dextromethorphan (PROMETHAZINE-DM) 6.25-15 MG/5ML syrup Take 5 mLs by mouth 4 (four) times daily as needed for cough. 100 mL Volney American, [place]      PDMP not reviewed this encounter.   Volney American, [place] 03/24/21 1919

## 2021-03-24 NOTE — ED Triage Notes (Signed)
Sore throat, temp was 100.5, chills and body aches.  Had pos home covid test today.  Would like antivirals. Pt would like pcr test done today

## 2021-03-25 LAB — SARS-COV-2, NAA 2 DAY TAT

## 2021-03-25 LAB — NOVEL CORONAVIRUS, NAA: SARS-CoV-2, NAA: DETECTED — AB

## 2021-03-26 ENCOUNTER — Ambulatory Visit: Payer: BC Managed Care – PPO | Admitting: Family Medicine

## 2021-04-14 ENCOUNTER — Other Ambulatory Visit (INDEPENDENT_AMBULATORY_CARE_PROVIDER_SITE_OTHER): Payer: Self-pay | Admitting: Nurse Practitioner

## 2021-04-21 ENCOUNTER — Ambulatory Visit (INDEPENDENT_AMBULATORY_CARE_PROVIDER_SITE_OTHER): Payer: BC Managed Care – PPO | Admitting: Family Medicine

## 2021-04-21 ENCOUNTER — Encounter: Payer: Self-pay | Admitting: Family Medicine

## 2021-04-21 ENCOUNTER — Other Ambulatory Visit: Payer: Self-pay

## 2021-04-21 VITALS — BP 120/84 | HR 72 | Temp 97.7°F | Ht 66.0 in | Wt 182.6 lb

## 2021-04-21 DIAGNOSIS — R7301 Impaired fasting glucose: Secondary | ICD-10-CM

## 2021-04-21 DIAGNOSIS — I10 Essential (primary) hypertension: Secondary | ICD-10-CM | POA: Diagnosis not present

## 2021-04-21 DIAGNOSIS — Z0000 Encounter for general adult medical examination without abnormal findings: Secondary | ICD-10-CM

## 2021-04-21 DIAGNOSIS — Z8616 Personal history of COVID-19: Secondary | ICD-10-CM

## 2021-04-21 DIAGNOSIS — E785 Hyperlipidemia, unspecified: Secondary | ICD-10-CM

## 2021-04-21 DIAGNOSIS — K219 Gastro-esophageal reflux disease without esophagitis: Secondary | ICD-10-CM

## 2021-04-21 DIAGNOSIS — K5790 Diverticulosis of intestine, part unspecified, without perforation or abscess without bleeding: Secondary | ICD-10-CM | POA: Insufficient documentation

## 2021-04-21 DIAGNOSIS — K648 Other hemorrhoids: Secondary | ICD-10-CM

## 2021-04-21 DIAGNOSIS — D259 Leiomyoma of uterus, unspecified: Secondary | ICD-10-CM | POA: Insufficient documentation

## 2021-04-21 HISTORY — DX: Other hemorrhoids: K64.8

## 2021-04-21 HISTORY — DX: Essential (primary) hypertension: I10

## 2021-04-21 HISTORY — DX: Diverticulosis of intestine, part unspecified, without perforation or abscess without bleeding: K57.90

## 2021-04-21 MED ORDER — AMLODIPINE BESY-BENAZEPRIL HCL 10-20 MG PO CAPS: 1.0000 | ORAL_CAPSULE | Freq: Every day | ORAL | 3 refills | Status: DC

## 2021-04-21 NOTE — Progress Notes (Signed)
Subjective  Chief Complaint  Patient presents with   Establish Care   Hypertension    HPI: Rachel Collier is a 60 y.o. female who presents to [facility] [facility] [place] today for a Female Wellness Visit. She also has the concerns and/or needs as listed above in the chief complaint. These will be addressed in addition to the Health Maintenance Visit.   Wellness Visit: annual visit with health maintenance review and exam without Pap  Health maintenance: 60 year old new patient appointment.  Reviewed old records from previous PCP.  She does see a gynecologist and needs to schedule for female wellness exam.  Her mammogram is due.  She goes to [facility].  She declines Shingrix and flu vaccinations at this time.  She works from home, full-time.  Married.  44 year old son lives in the home.  Has an older son who lives outside of the home.  Happy.  No mood problems.  Active.  Would like to lose weight with healthy diet.  Has been successful in recent past but "fell off the wagon". Chronic disease f/u and/or acute problem visit: (deemed necessary to be done in addition to the wellness visit): Impaired fasting glucose: Most recent A1c was a year ago.  Prediabetic range.  Depends on diet.  Positive family history of diabetes in her father.  No symptoms of hypoglycemia. Hypertension on Lotrel 10/20 daily.  Blood pressures have been well controlled.  Due for renal function electrolyte check.  No chest pain or shortness of breath.  Strong family history of hypertension.  No premature coronary artery disease. History of hyperlipidemia on statin.  Was able to come off with better diet and weight loss.  No adverse effects.  Nonfasting today Reviewed colonoscopy from 2015.  Normal except for diverticulosis and internal hemorrhoids.  No history of diverticulitis. Had COVID last month.  Fortunately did well.  She is vaccinated.  Assessment  1. Annual physical exam   2. IFG (impaired  fasting glucose)   3. Essential hypertension   4. Gastroesophageal reflux disease, unspecified whether esophagitis present   5. Dyslipidemia   6. Diverticulosis   7. Personal history of COVID-19      Plan  Female Wellness Visit: Age appropriate Health Maintenance and Prevention measures were discussed with patient. Included topics are cancer screening recommendations, ways to keep healthy (see AVS) including dietary and exercise recommendations, regular eye and dental care, use of seat belts, and avoidance of moderate alcohol use and tobacco use.  Patient to schedule with GYN and update mammogram and female wellness visit.  Pap smear is up-to-date.  Colorectal cancer screenings are up-to-date.  Normal.  Reviewed. BMI: discussed patient's BMI and encouraged positive lifestyle modifications to help get to or maintain a target BMI. HM needs and immunizations were addressed and ordered. See below for orders. See HM and immunization section for updates.  Declines vaccinations at this time.  Education given Routine labs and screening tests ordered including cmp, cbc and lipids where appropriate. Discussed recommendations regarding Vit D and calcium supplementation (see AVS)  Chronic disease management visit and/or acute problem visit: Hypertension is fairly well controlled on Lotrel 10/20 daily.  We will check renal function electrolytes and lipid panel today.  Recommend low-salt diet and weight loss.  Patient will work on [employer] again. History of dyslipidemia: Recheck lipid panel and cardiovascular risk alert.  Discussed possibly starting statin again if indicated.  Patient agrees GERD is well controlled with dietary management.  No  longer needing PPIs at this time.  Has history of stricture.  History of EGD.  Chart reviewed Asymptomatic diverticulosis noted on colonoscopy Personal history of COVID: Fortunately, completely resolved.  Vaccinated. IFG: Recommend low-carb diet.  Weight loss.   Recheck A1c today.  Follow up: 6 months recheck blood pressure Orders Placed This Encounter  Procedures   CBC with Differential/Platelet   Comprehensive metabolic panel   Lipid panel   Hemoglobin A1c   TSH   Hepatitis C antibody   Meds ordered this encounter  Medications   amLODipine-benazepril (LOTREL) 10-20 MG capsule    Sig: Take 1 capsule by mouth daily.    Dispense:  90 capsule    Refill:  3      Body mass index is 29.47 kg/m. Wt Readings from Last 3 Encounters:  04/21/21 182 lb 9.6 oz (82.8 kg)  01/14/21 181 lb 6.4 oz (82.3 kg)  09/02/20 178 lb 12.8 oz (81.1 kg)     Patient Active Problem List   Diagnosis Date Noted   IFG (impaired fasting glucose) 04/21/2021   Essential hypertension 04/21/2021   Uterine leiomyoma 04/21/2021   Diverticulosis 04/21/2021    By colonoscopy 2015, Dr. Oneida Alar.     Internal hemorrhoids 04/21/2021    By colonoscopy 2015    GERD (gastroesophageal reflux disease) 11/16/2013   Health Maintenance  Topic Date Due   Hepatitis C Screening  Never done   COVID-19 Vaccine (3 - Booster for Moderna series) 01/30/2020   MAMMOGRAM  06/20/2021 (Originally 12/11/2019)   Zoster Vaccines- Shingrix (1 of 2) 07/22/2021 (Originally 02/26/2012)   INFLUENZA VACCINE  09/19/2021 (Originally 01/20/2021)   PAP SMEAR-Modifier  12/10/2021   COLONOSCOPY (Pts 45-39yrs Insurance coverage will need to be confirmed)  08/01/2023   TETANUS/TDAP  05/20/2030   Pneumococcal Vaccine 21-4 Years old  Aged Out   HPV VACCINES  Aged Out   HIV Screening  Discontinued   Immunization History  Administered Date(s) Administered   [employer] Vaccination 11/04/2019, 12/05/2019   Tdap 05/20/2020   We updated and reviewed the patient's past history in detail and it is documented below. Allergies: Patient has No Known Allergies. Past Medical History Patient  has a past medical history of Diverticulosis (04/21/2021), Essential hypertension (04/21/2021), Hyperlipidemia,  Hypertension, Internal hemorrhoids (04/21/2021), and Vertigo. Past Surgical History Patient  has a past surgical history that includes Cesarean section; Right bunionectomy; Cyst removed from left foot; Colonoscopy (N/A, 07/31/2013); Esophagogastroduodenoscopy (N/A, 11/24/2013); and Savory dilation (N/A, 11/24/2013). Family History: Patient family history includes Healthy in her father and mother. Social History:  Patient  reports that she has never smoked. She has never used smokeless tobacco. She reports that she does not drink alcohol and does not use drugs.  Review of Systems: Constitutional: negative for fever or malaise Ophthalmic: negative for photophobia, double vision or loss of vision Cardiovascular: negative for chest pain, dyspnea on exertion, or new LE swelling Respiratory: negative for SOB or persistent cough Gastrointestinal: negative for abdominal pain, change in bowel habits or melena Genitourinary: negative for dysuria or gross hematuria, no abnormal uterine bleeding or disharge Musculoskeletal: negative for new gait disturbance or muscular weakness Integumentary: negative for new or persistent rashes, no breast lumps Neurological: negative for TIA or stroke symptoms Psychiatric: negative for SI or delusions Allergic/Immunologic: negative for hives  Patient Care Team    Relationship Specialty Notifications Start End  Leamon Arnt, MD PCP - General Family Medicine  04/21/21   Danie Binder, MD (Inactive) Consulting Physician Gastroenterology  06/20/14     Objective  Vitals: BP 120/84   Pulse 72   Temp 97.7 F (36.5 C) (Temporal)   Ht 5\' 6"  (1.676 m)   Wt 182 lb 9.6 oz (82.8 kg)   SpO2 96%   BMI 29.47 kg/m  General:  Well developed, well nourished, no acute distress  Psych:  Alert and orientedx3,normal mood and affect HEENT:  Normocephalic, atraumatic, non-icteric sclera,  supple neck without adenopathy, mass or thyromegaly Cardiovascular:  Normal S1, S2, RRR  without gallop, rub or murmur Respiratory:  Good breath sounds bilaterally, CTAB with normal respiratory effort Gastrointestinal: normal bowel sounds, soft, non-tender, no noted masses. No HSM MSK: no deformities, contusions. Joints are without erythema or swelling.  Skin:  Warm, no rashes or suspicious lesions noted Neurologic:    Mental status is normal. CN 2-11 are normal. Gross motor and sensory exams are normal. Normal gait. No tremor   Commons side effects, risks, benefits, and alternatives for medications and treatment plan prescribed today were discussed, and the patient expressed understanding of the given instructions. Patient is instructed to call or message via MyChart if he/she has any questions or concerns regarding our treatment plan. No barriers to understanding were identified. We discussed Red Flag symptoms and signs in detail. Patient expressed understanding regarding what to do in case of urgent or emergency type symptoms.  Medication list was reconciled, printed and provided to the patient in AVS. Patient instructions and summary information was reviewed with the patient as documented in the AVS. This note was prepared with assistance of Dragon voice recognition software. Occasional wrong-word or sound-a-like substitutions may have occurred due to the inherent limitations of voice recognition software  This visit occurred during the SARS-CoV-2 public health emergency.  Safety protocols were in place, including screening questions prior to the visit, additional usage of staff PPE, and extensive cleaning of exam room while observing appropriate contact time as indicated for disinfecting solutions.

## 2021-04-21 NOTE — Patient Instructions (Signed)
Please return in 6 months for hypertension follow up.   I will release your lab results to you on your MyChart account with further instructions. Please reply with any questions.    Please have GYN send me the mammogram report and OV notes.   It was a pleasure meeting you today! Thank you for choosing [place] to meet your healthcare needs! I truly look forward to working with you. If you have any questions or concerns, please send me a message via Mychart or call the office at [phone].

## 2021-04-22 ENCOUNTER — Encounter: Payer: Self-pay | Admitting: Family Medicine

## 2021-04-22 DIAGNOSIS — E782 Mixed hyperlipidemia: Secondary | ICD-10-CM

## 2021-04-22 HISTORY — DX: Mixed hyperlipidemia: E78.2

## 2021-04-22 LAB — LIPID PANEL
Cholesterol: 245 mg/dL — ABNORMAL HIGH (ref 0–200)
HDL: 49 mg/dL (ref >39.00)
LDL Cholesterol: 173 mg/dL — ABNORMAL HIGH (ref 0–99)
NonHDL: 196.29
Total CHOL/HDL Ratio: 5
Triglycerides: 116 mg/dL (ref 0.0–149.0)
VLDL: 23.2 mg/dL (ref 0.0–40.0)

## 2021-04-22 LAB — TSH: TSH: 1.49 u[IU]/mL (ref 0.35–5.50)

## 2021-04-22 LAB — HEMOGLOBIN A1C: Hgb A1c MFr Bld: 6 % (ref 4.6–6.5)

## 2021-04-22 LAB — CBC WITH DIFFERENTIAL/PLATELET
Basophils Absolute: 0 10*3/uL (ref 0.0–0.1)
Basophils Relative: 0.5 % (ref 0.0–3.0)
Eosinophils Absolute: 0 10*3/uL (ref 0.0–0.7)
Eosinophils Relative: 0.5 % (ref 0.0–5.0)
HCT: 41.2 % (ref 36.0–46.0)
Hemoglobin: 13.8 g/dL (ref 12.0–15.0)
Lymphocytes Relative: 30.5 % (ref 12.0–46.0)
Lymphs Abs: 2.1 10*3/uL (ref 0.7–4.0)
MCHC: 33.6 g/dL (ref 30.0–36.0)
MCV: 85.8 fl (ref 78.0–100.0)
Monocytes Absolute: 0.4 10*3/uL (ref 0.1–1.0)
Monocytes Relative: 5.9 % (ref 3.0–12.0)
Neutro Abs: 4.2 10*3/uL (ref 1.4–7.7)
Neutrophils Relative %: 62.6 % (ref 43.0–77.0)
Platelets: 327 10*3/uL (ref 150.0–400.0)
RBC: 4.8 Mil/uL (ref 3.87–5.11)
RDW: 13.2 % (ref 11.5–15.5)
WBC: 6.8 10*3/uL (ref 4.0–10.5)

## 2021-04-22 LAB — HEPATITIS C ANTIBODY
Hepatitis C Ab: NONREACTIVE
SIGNAL TO CUT-OFF: 0.08 (ref <1.00)

## 2021-04-22 LAB — COMPREHENSIVE METABOLIC PANEL
ALT: 25 U/L (ref 0–35)
AST: 21 U/L (ref 0–37)
Albumin: 4.6 g/dL (ref 3.5–5.2)
Alkaline Phosphatase: 62 U/L (ref 39–117)
BUN: 11 mg/dL (ref 6–23)
CO2: 30 mEq/L (ref 19–32)
Calcium: 9.3 mg/dL (ref 8.4–10.5)
Chloride: 103 mEq/L (ref 96–112)
Creatinine, Ser: 0.96 mg/dL (ref 0.40–1.20)
GFR: 64.92 mL/min (ref >60.00)
Glucose, Bld: 77 mg/dL (ref 70–99)
Potassium: 4.1 mEq/L (ref 3.5–5.1)
Sodium: 141 mEq/L (ref 135–145)
Total Bilirubin: 0.5 mg/dL (ref 0.2–1.2)
Total Protein: 8.1 g/dL (ref 6.0–8.3)

## 2021-04-23 ENCOUNTER — Other Ambulatory Visit: Payer: Self-pay

## 2021-04-23 ENCOUNTER — Telehealth: Payer: Self-pay

## 2021-04-23 MED ORDER — ATORVASTATIN CALCIUM 10 MG PO TABS: 10.0000 mg | ORAL_TABLET | Freq: Every day | ORAL | 3 refills | Status: DC

## 2021-04-23 MED ORDER — AMLODIPINE BESY-BENAZEPRIL HCL 10-20 MG PO CAPS: 1.0000 | ORAL_CAPSULE | Freq: Every day | ORAL | 3 refills | Status: DC

## 2021-04-23 NOTE — Telephone Encounter (Signed)
Patient is returning a call from someone, states no vm was left.

## 2021-04-23 NOTE — Telephone Encounter (Signed)
Spoke with patient, gave a verbal understanding °

## 2021-05-27 ENCOUNTER — Encounter (INDEPENDENT_AMBULATORY_CARE_PROVIDER_SITE_OTHER): Payer: BC Managed Care – PPO | Admitting: Internal Medicine

## 2021-07-11 DIAGNOSIS — Z01419 Encounter for gynecological examination (general) (routine) without abnormal findings: Secondary | ICD-10-CM | POA: Diagnosis not present

## 2021-07-11 DIAGNOSIS — Z1231 Encounter for screening mammogram for malignant neoplasm of breast: Secondary | ICD-10-CM | POA: Diagnosis not present

## 2021-07-11 DIAGNOSIS — Z130 Encounter for screening for diseases of the blood and blood-forming organs and certain disorders involving the immune mechanism: Secondary | ICD-10-CM | POA: Diagnosis not present

## 2021-07-11 DIAGNOSIS — Z6829 Body mass index (BMI) 29.0-29.9, adult: Secondary | ICD-10-CM | POA: Diagnosis not present

## 2021-07-11 DIAGNOSIS — Z1389 Encounter for screening for other disorder: Secondary | ICD-10-CM | POA: Diagnosis not present

## 2021-07-14 LAB — HM PAP SMEAR

## 2021-09-17 DIAGNOSIS — H524 Presbyopia: Secondary | ICD-10-CM | POA: Diagnosis not present

## 2021-09-17 DIAGNOSIS — H5213 Myopia, bilateral: Secondary | ICD-10-CM | POA: Diagnosis not present

## 2021-09-17 DIAGNOSIS — H2513 Age-related nuclear cataract, bilateral: Secondary | ICD-10-CM | POA: Diagnosis not present

## 2021-09-17 DIAGNOSIS — H43811 Vitreous degeneration, right eye: Secondary | ICD-10-CM | POA: Diagnosis not present

## 2021-09-17 DIAGNOSIS — H04123 Dry eye syndrome of bilateral lacrimal glands: Secondary | ICD-10-CM | POA: Diagnosis not present

## 2021-10-20 ENCOUNTER — Ambulatory Visit: Payer: BC Managed Care – PPO | Admitting: Family Medicine

## 2022-02-11 ENCOUNTER — Other Ambulatory Visit (INDEPENDENT_AMBULATORY_CARE_PROVIDER_SITE_OTHER): Payer: BC Managed Care – PPO

## 2022-02-11 ENCOUNTER — Encounter: Payer: Self-pay | Admitting: Family Medicine

## 2022-02-11 ENCOUNTER — Ambulatory Visit (INDEPENDENT_AMBULATORY_CARE_PROVIDER_SITE_OTHER): Payer: BC Managed Care – PPO | Admitting: Family Medicine

## 2022-02-11 VITALS — BP 122/78 | HR 73 | Temp 98.1°F | Ht 66.0 in | Wt 185.2 lb

## 2022-02-11 DIAGNOSIS — I10 Essential (primary) hypertension: Secondary | ICD-10-CM | POA: Diagnosis not present

## 2022-02-11 DIAGNOSIS — R490 Dysphonia: Secondary | ICD-10-CM

## 2022-02-11 DIAGNOSIS — E663 Overweight: Secondary | ICD-10-CM

## 2022-02-11 DIAGNOSIS — E782 Mixed hyperlipidemia: Secondary | ICD-10-CM

## 2022-02-11 DIAGNOSIS — J301 Allergic rhinitis due to pollen: Secondary | ICD-10-CM | POA: Diagnosis not present

## 2022-02-11 LAB — COMPREHENSIVE METABOLIC PANEL
ALT: 29 U/L (ref 0–35)
AST: 20 U/L (ref 0–37)
Albumin: 4.4 g/dL (ref 3.5–5.2)
Alkaline Phosphatase: 78 U/L (ref 39–117)
BUN: 14 mg/dL (ref 6–23)
CO2: 30 mEq/L (ref 19–32)
Calcium: 9.2 mg/dL (ref 8.4–10.5)
Chloride: 102 mEq/L (ref 96–112)
Creatinine, Ser: 0.89 mg/dL (ref 0.40–1.20)
GFR: 70.7 mL/min (ref >60.00)
Glucose, Bld: 96 mg/dL (ref 70–99)
Potassium: 3.8 mEq/L (ref 3.5–5.1)
Sodium: 140 mEq/L (ref 135–145)
Total Bilirubin: 0.6 mg/dL (ref 0.2–1.2)
Total Protein: 7.7 g/dL (ref 6.0–8.3)

## 2022-02-11 LAB — LIPID PANEL
Cholesterol: 144 mg/dL (ref 0–200)
HDL: 45.5 mg/dL (ref >39.00)
LDL Cholesterol: 82 mg/dL (ref 0–99)
NonHDL: 98.61
Total CHOL/HDL Ratio: 3
Triglycerides: 82 mg/dL (ref 0.0–149.0)
VLDL: 16.4 mg/dL (ref 0.0–40.0)

## 2022-02-11 MED ORDER — FLUTICASONE PROPIONATE 50 MCG/ACT NA SUSP: 1.0000 | Freq: Every day | NASAL | 6 refills | Status: DC

## 2022-02-11 NOTE — Patient Instructions (Addendum)
Please follow up as scheduled for your next visit with me: 06/08/2022   I will release your lab results to you on your MyChart account with further instructions. You may see the results before I do, but when I review them I will send you a message with my report or have my assistant call you if things need to be discussed. Please reply to my message with any questions. Thank you!   If you have any questions or concerns, please don't hesitate to send me a message via MyChart or call the office at [phone]. Thank you for visiting with [place] today! It's our pleasure caring for you.   Protein Content in Foods Protein is a necessary nutrient in any diet. It helps build and repair muscles, bones, and skin. Depending on your overall health, you may need more or less protein in your diet. You are encouraged to eat a variety of protein foods to ensure that you get all the essential nutrients that are found in different protein foods. Talk with your health care provider or dietitian about how much protein you need each day and which sources of protein are best for you. Protein is especially important for: Repairing and making cells and tissues. Fighting infection. Providing energy. Growth and development. See the following list for the protein content of some common foods. What are tips for getting more protein in your diet? Try to replace processed carbohydrates with high-quality protein. Snack on nuts and seeds instead of chips. Replace baked desserts with [place] yogurt. Eat protein foods from both plant and animal sources. Replace red meat with seafood. Add beans and peas to salads, soups, and side dishes. Include a protein food with each meal and snack. Reading food labels You can find the amount of protein in a food item by looking at the nutrition facts label. Use the total grams listed to help you reach your daily goal. What foods are high in protein?  High-protein foods contain 4 grams (g) or  more of protein per serving. They include: Grains Quinoa (cooked) -- 1 cup (185 g) has 8 g of protein. Whole wheat pasta (cooked) -- 1 cup (140 g) has 6 g of protein. Meat Beef, ground sirloin (cooked) -- 3 oz (85 g) has 24 g of protein. Chicken breast, boneless and skinless (cooked) -- 3 oz (85 g) has 25 g of protein. Egg -- 1 egg has 6 g of protein. Fish, filet (cooked) -- 1 oz (28 g) has 6-7 g of protein. Lamb (cooked) -- 3 oz (85 g) has 24 g of protein. Pork tenderloin (cooked) -- 3 oz (85 g) has 23 g of protein. Tuna (canned in water) -- 3 oz (85 g) has 20 g of protein. Dairy Cottage cheese --  cup (114 g) has 13.4 g of protein. Milk -- 1 cup (237 mL) has 8 g of protein. Cheese (hard) -- 1 oz (28 g) has 7 g of protein. Yogurt, regular -- 6 oz (170 g) has 8 g of protein. Greek yogurt -- 6 oz (200 g) has 18 g protein. Plant protein Garbanzo beans (canned or cooked) --  cup (130 g) has 6-7 g of protein. Kidney beans (canned or cooked) --  cup (130 g) has 6-7 g of protein. Nuts (peanuts, pistachios, almonds) -- 1 oz (28 g) has 6 g of protein. Peanut butter -- 1 oz (32 g) has 7-8 g of protein. Pumpkin seeds -- 1 oz (28 g) has 8.5 g of protein. Soybeans (roasted) --  1 oz (28 g) has 8 g of protein. Soybeans (cooked) --  cup (90 g) has 11 g of protein. Soy milk -- 1 cup (250 mL) has 5-10 g of protein. Soy or vegetable patty -- 1 patty has 11 g of protein. Sunflower seeds -- 1 oz (28 g) has 5.5 g of protein. Buckwheat -- 1 oz (33 g) has 4.3 g of protein. Tofu (firm) --  cup (124 g) has 20 g of protein. Tempeh --  cup (83 g) has 16 g of protein. The items listed above may not be a complete list of foods high in protein. Actual amounts of protein may differ depending on processing. Contact a dietitian for more information. What foods are low in protein?  Low-protein foods contain 3 grams (g) or less of protein per serving. They include: Fruits Fruit or vegetable juice --  cup  (125 mL) has 1 g of protein. Vegetables Beets (raw or cooked) --  cup (68 g) has 1.5 g of protein. Broccoli (raw or cooked) --  cup (44 g) has 2 g of protein. Collard greens (raw or cooked) --  cup (42 g) has 2 g of protein. Green beans (raw or cooked) --  cup (83 g) has 1 g of protein. Green peas (canned) --  cup (80 g) has 3.5 g of protein. Potato (baked with skin) -- 1 medium potato (173 g) has 3 g of protein. Spinach (cooked) --  cup (90 g) has 3 g of protein. Squash (cooked) --  cup (90 g) has 1.5 g of protein. Avocado -- 1 cup (146 g) has 2.7 g of protein. Grains Bran cereal --  cup (30 g) has 2-3 g of protein. Bread -- 1 slice has 2.5 g of protein. Corn (fresh or cooked) --  cup (77 g) has 2 g of protein. Flour tortilla -- One 6-inch (15 cm) tortilla has 2.5 g of protein. Muffins -- 1 small muffin (2 oz or 57 g) has 3 g of protein. Oatmeal (cooked) --  cup (40 g) has 3 g of protein. Rice (cooked) --  cup (79 g) has 2.5-3.5 g of protein. Dairy Cream cheese -- 1 oz (29 g) has 2 g of protein. Creamer (half-and-half) -- 1 oz (29 mL) has 1 g of protein. Frozen yogurt --  cup (72 g) has 3 g of protein. Sour cream --  cup (75 g) has 2.5 g of protein. The items listed above may not be a complete list of foods low in protein. Actual amounts of protein may differ depending on processing. Contact a dietitian for more information. Summary Protein is a nutrient that your body needs for growth and development, repairing and making cells and tissues, fighting infection, and providing energy. Protein is in both plant and animal foods. Some of these foods have more protein than others. Depending on your overall health, you may need more or less protein in your diet. Talk to your health care provider about how much protein you need. This information is not intended to replace advice given to you by your health care provider. Make sure you discuss any questions you have with your health  care provider. Document Revised: 05/13/2020 Document Reviewed: 05/13/2020 Elsevier Patient Education  [place].

## 2022-02-11 NOTE — Progress Notes (Signed)
Subjective  CC:  Chief Complaint  Patient presents with   Hypertension    Pt here to F/U with Bp. Pt has been congested for the past month.   Hyperlipidemia   Allergic Rhinitis     HPI: Rachel Collier is a 61 y.o. female who presents to the office today to address the problems listed above in the chief complaint. Hypertension f/u: Control is good . Pt reports she is doing well. taking medications as instructed, no medication side effects noted, no TIAs, no chest pain on exertion, no dyspnea on exertion, no swelling of ankles. She denies adverse effects from his BP medications. Compliance with medication is good.  HLD: now on crestor 10 and tolerates well. Due for recheck.  Overweight: now working out at [employer] regularly. Discussed diet: too many calories and needs more lean protein and veggies. C/o 4-6 weeks of allergies, PND and hoarseness. No ST or fever or sinus pain. No sob. Has taken a few zyrtecs w/o relief.   Assessment  1. Essential hypertension   2. Mixed hyperlipidemia   3. Seasonal allergic rhinitis due to pollen   4. Hoarseness   5. Overweight      Plan   Hypertension f/u: BP control is well controlled. Continue lotrel. Monitor lytes. HLD: recheck fasting lipids and lfts on crestor 10.  SAR: start nightly zyrtec and daily flonase. Education given Discussed dietary changes and problem solved around meal planning and workouts.   Education regarding management of these chronic disease states was given. Management strategies discussed on successive visits include dietary and exercise recommendations, goals of achieving and maintaining IBW, and lifestyle modifications aiming for adequate sleep and minimizing stressors.   Follow up: dec for cpe.   Orders Placed This Encounter  Procedures   Comprehensive metabolic panel   Lipid panel   Meds ordered this encounter  Medications   fluticasone (FLONASE) 50 MCG/ACT nasal spray    Sig: Place 1 spray into both nostrils  daily.    Dispense:  16 g    Refill:  6      BP Readings from Last 3 Encounters:  02/11/22 122/78  04/21/21 120/84  03/24/21 (!) 145/90   Wt Readings from Last 3 Encounters:  02/11/22 185 lb 3.2 oz (84 kg)  04/21/21 182 lb 9.6 oz (82.8 kg)  01/14/21 181 lb 6.4 oz (82.3 kg)    Lab Results  Component Value Date   CHOL 245 (H) 04/21/2021   CHOL 214 (H) 06/20/2020   CHOL 236 (H) 02/15/2020   Lab Results  Component Value Date   HDL 49.00 04/21/2021   HDL 55 06/20/2020   HDL 47 (L) 02/15/2020   Lab Results  Component Value Date   LDLCALC 173 (H) 04/21/2021   LDLCALC 144 (H) 06/20/2020   LDLCALC 161 (H) 02/15/2020   Lab Results  Component Value Date   TRIG 116.0 04/21/2021   TRIG 61 06/20/2020   TRIG 146 02/15/2020   Lab Results  Component Value Date   CHOLHDL 5 04/21/2021   CHOLHDL 3.9 06/20/2020   CHOLHDL 5.0 (H) 02/15/2020   No results found for: "LDLDIRECT" Lab Results  Component Value Date   CREATININE 0.96 04/21/2021   BUN 11 04/21/2021   NA 141 04/21/2021   K 4.1 04/21/2021   CL 103 04/21/2021   CO2 30 04/21/2021    The 10-year ASCVD risk score (Arnett DK, et al., 2019) is: 7.2%   Values used to calculate the score:  Age: 66 years     Sex: Female     Is Non-Hispanic African American: Yes     Diabetic: No     Tobacco smoker: No     Systolic Blood Pressure: 428 mmHg     Is BP treated: Yes     HDL Cholesterol: 49 mg/dL     Total Cholesterol: 245 mg/dL  I reviewed the patients updated PMH, FH, and SocHx.    Patient Active Problem List   Diagnosis Date Noted   Mixed hyperlipidemia 04/22/2021   IFG (impaired fasting glucose) 04/21/2021   Essential hypertension 04/21/2021   Uterine leiomyoma 04/21/2021   Diverticulosis 04/21/2021   Internal hemorrhoids 04/21/2021   GERD (gastroesophageal reflux disease) 11/16/2013    Allergies: Patient has no known allergies.  Social History: Patient  reports that she has never smoked. She has never  used smokeless tobacco. She reports that she does not drink alcohol and does not use drugs.  Current Meds  Medication Sig   amLODipine-benazepril (LOTREL) 10-20 MG capsule Take 1 capsule by mouth daily.   atorvastatin (LIPITOR) 10 MG tablet Take 1 tablet (10 mg total) by mouth daily.   Cholecalciferol (VITAMIN D3) 125 MCG (5000 UT) TABS Take 2 tablets by mouth daily at 12 noon.   fluticasone (FLONASE) 50 MCG/ACT nasal spray Place 1 spray into both nostrils daily.    Review of Systems: Cardiovascular: negative for chest pain, palpitations, leg swelling, orthopnea Respiratory: negative for SOB, wheezing or persistent cough Gastrointestinal: negative for abdominal pain Genitourinary: negative for dysuria or gross hematuria  Objective  Vitals: BP 122/78   Pulse 73   Temp 98.1 F (36.7 C)   Ht '5\' 6"'$  (1.676 m)   Wt 185 lb 3.2 oz (84 kg)   SpO2 90%   BMI 29.89 kg/m  General: no acute distress  Psych:  Alert and oriented, normal mood and affect HEENT:  Normocephalic, atraumatic, supple neck  Cardiovascular:  RRR without murmur. no edema Respiratory:  Good breath sounds bilaterally, CTAB with normal respiratory effort Skin:  Warm, no rashes Neurologic:   Mental status is normal Commons side effects, risks, benefits, and alternatives for medications and treatment plan prescribed today were discussed, and the patient expressed understanding of the given instructions. Patient is instructed to call or message via MyChart if he/she has any questions or concerns regarding our treatment plan. No barriers to understanding were identified. We discussed Red Flag symptoms and signs in detail. Patient expressed understanding regarding what to do in case of urgent or emergency type symptoms.  Medication list was reconciled, printed and provided to the patient in AVS. Patient instructions and summary information was reviewed with the patient as documented in the AVS. This note was prepared with  assistance of Dragon voice recognition software. Occasional wrong-word or sound-a-like substitutions may have occurred due to the inherent limitations of voice recognition software  This visit occurred during the SARS-CoV-2 public health emergency.  Safety protocols were in place, including screening questions prior to the visit, additional usage of staff PPE, and extensive cleaning of exam room while observing appropriate contact time as indicated for disinfecting solutions.

## 2022-03-16 ENCOUNTER — Encounter: Payer: Self-pay | Admitting: *Deleted

## 2022-05-18 ENCOUNTER — Other Ambulatory Visit: Payer: Self-pay | Admitting: Family Medicine

## 2022-05-30 ENCOUNTER — Other Ambulatory Visit: Payer: Self-pay | Admitting: Family Medicine

## 2022-06-04 ENCOUNTER — Encounter: Payer: Self-pay | Admitting: *Deleted

## 2022-06-08 ENCOUNTER — Encounter: Payer: Self-pay | Admitting: Family Medicine

## 2022-06-08 ENCOUNTER — Ambulatory Visit (INDEPENDENT_AMBULATORY_CARE_PROVIDER_SITE_OTHER): Payer: BC Managed Care – PPO | Admitting: Family Medicine

## 2022-06-08 VITALS — BP 109/68 | HR 81 | Temp 98.0°F | Ht 66.0 in | Wt 187.2 lb

## 2022-06-08 DIAGNOSIS — Z0000 Encounter for general adult medical examination without abnormal findings: Secondary | ICD-10-CM

## 2022-06-08 DIAGNOSIS — E782 Mixed hyperlipidemia: Secondary | ICD-10-CM | POA: Diagnosis not present

## 2022-06-08 DIAGNOSIS — K219 Gastro-esophageal reflux disease without esophagitis: Secondary | ICD-10-CM | POA: Diagnosis not present

## 2022-06-08 DIAGNOSIS — R7301 Impaired fasting glucose: Secondary | ICD-10-CM | POA: Diagnosis not present

## 2022-06-08 DIAGNOSIS — I10 Essential (primary) hypertension: Secondary | ICD-10-CM | POA: Diagnosis not present

## 2022-06-08 NOTE — Progress Notes (Signed)
Subjective  Chief Complaint  Patient presents with   Annual Exam    Pt has no questions or concerns     HPI: Rachel Collier is a 61 y.o. female who presents to [facility] today for a Female Wellness Visit. She also has the concerns and/or needs as listed above in the chief complaint. These will be addressed in addition to the Health Maintenance Visit.   Wellness Visit: annual visit with health maintenance review and exam without Pap  Health maintenance: Sees GYN annually and has appointment scheduled for the beginning of the year.  Mammogram, Pap smear and colorectal cancer screenings are current.  Next colonoscopy due in 2025.  Mammogram due in January.  Feeling well.  Has not been eating that well and has not been exercising, was caring for a sick aunt who recently passed away.  She would like to get started on exercise and diet again.  She declines influenza vaccination and Shingrix vaccination, education given. Chronic disease f/u and/or acute problem visit: (deemed necessary to be done in addition to the wellness visit): Hypertension remains well-controlled.  No chest pain shortness of breath or lower extremity edema.  Due for labs Nonfasting for repeat lipid work on statin. History of impaired fasting glucose: Diet has only been fair.  No symptoms of hyperglycemia History of GERD: Not currently active, not on medications  Assessment  1. Annual physical exam   2. Essential hypertension   3. Mixed hyperlipidemia   4. IFG (impaired fasting glucose)   5. Gastroesophageal reflux disease, unspecified whether esophagitis present      Plan  Female Wellness Visit: Age appropriate Health Maintenance and Prevention measures were discussed with patient. Included topics are cancer screening recommendations, ways to keep healthy (see AVS) including dietary and exercise recommendations, regular eye and dental care, use of seat belts, and avoidance of moderate  alcohol use and tobacco use.  BMI: discussed patient's BMI and encouraged positive lifestyle modifications to help get to or maintain a target BMI. HM needs and immunizations were addressed and ordered. See below for orders. See HM and immunization section for updates.  Defers Shingrix and flu vaccines.  Education given Routine labs and screening tests ordered including cmp, cbc and lipids where appropriate. Discussed recommendations regarding Vit D and calcium supplementation (see AVS)  Chronic disease management visit and/or acute problem visit: Hypertension is well-controlled on amlodipine benazepril10/25 daily.  Recheck renal function lites.  Recommend low-fat diet, low-sodium diet, weight loss. Hyperlipidemia tolerating Lipitor 10 mg daily.  No adverse effects.  Recheck lipid panel today.  Recommend low-fat diet and weight loss Recheck in hemoglobin A1c for history of IFG.  Follow up: 6 months for blood pressure recheck Orders Placed This Encounter  Procedures   CBC with Differential/Platelet   Comprehensive metabolic panel   Hemoglobin A1c   Lipid panel   TSH   No orders of the defined types were placed in this encounter.     Body mass index is 30.21 kg/m. Wt Readings from Last 3 Encounters:  06/08/22 187 lb 3.2 oz (84.9 kg)  02/11/22 185 lb 3.2 oz (84 kg)  04/21/21 182 lb 9.6 oz (82.8 kg)     Patient Active Problem List   Diagnosis Date Noted   Mixed hyperlipidemia 04/22/2021    Started statin 03/2021    IFG (impaired fasting glucose) 04/21/2021   Essential hypertension 04/21/2021   Uterine leiomyoma 04/21/2021   Diverticulosis 04/21/2021    By colonoscopy  2015, Dr. Oneida Alar.     Internal hemorrhoids 04/21/2021    By colonoscopy 2015    GERD (gastroesophageal reflux disease) 11/16/2013   Health Maintenance  Topic Date Due   COVID-19 Vaccine (3 - 2023-24 season) 06/24/2022 (Originally 02/20/2022)   Zoster Vaccines- Shingrix (1 of 2) 09/07/2022 (Originally  02/26/2012)   INFLUENZA VACCINE  09/21/2022 (Originally 01/20/2022)   MAMMOGRAM  07/06/2022   COLONOSCOPY (Pts 45-76yr Insurance coverage will need to be confirmed)  08/01/2023   PAP SMEAR-Modifier  07/06/2026   DTaP/Tdap/Td (2 - Td or Tdap) 05/20/2030   Hepatitis C Screening  Completed   HPV VACCINES  Aged Out   HIV Screening  Discontinued   Immunization History  Administered Date(s) Administered   Moderna Sars-Covid-2 Vaccination 11/04/2019, 12/05/2019   Tdap 05/20/2020   We updated and reviewed the patient's past history in detail and it is documented below. Allergies: Patient has No Known Allergies. Past Medical History Patient  has a past medical history of Diverticulosis (04/21/2021), Essential hypertension (04/21/2021), Hyperlipidemia, Hypertension, Internal hemorrhoids (04/21/2021), Mixed hyperlipidemia (04/22/2021), and Vertigo. Past Surgical History Patient  has a past surgical history that includes Cesarean section; Right bunionectomy; Cyst removed from left foot; Colonoscopy (N/A, 07/31/2013); Esophagogastroduodenoscopy (N/A, 11/24/2013); and Savory dilation (N/A, 11/24/2013). Family History: Patient family history includes Diabetes in her father; Hyperlipidemia in her mother; Hypertension in her brother, father, mother, and sister; Kidney disease in her father; Prostate cancer in her son and son. Social History:  Patient  reports that she has never smoked. She has never used smokeless tobacco. She reports that she does not drink alcohol and does not use drugs.  Review of Systems: Constitutional: negative for fever or malaise Ophthalmic: negative for photophobia, double vision or loss of vision Cardiovascular: negative for chest pain, dyspnea on exertion, or new LE swelling Respiratory: negative for SOB or persistent cough Gastrointestinal: negative for abdominal pain, change in bowel habits or melena Genitourinary: negative for dysuria or gross hematuria, no abnormal uterine  bleeding or disharge Musculoskeletal: negative for new gait disturbance or muscular weakness Integumentary: negative for new or persistent rashes, no breast lumps Neurological: negative for TIA or stroke symptoms Psychiatric: negative for SI or delusions Allergic/Immunologic: negative for hives  Patient Care Team    Relationship Specialty Notifications Start End  ALeamon Arnt MD PCP - General Family Medicine  04/21/21   FDanie Binder MD (Inactive) Consulting Physician Gastroenterology  06/20/14     Objective  Vitals: BP 109/68 (BP Location: Left Arm, Patient Position: Sitting)   Pulse 81   Temp 98 F (36.7 C) (Temporal)   Ht '5\' 6"'$  (1.676 m)   Wt 187 lb 3.2 oz (84.9 kg)   SpO2 96%   BMI 30.21 kg/m  General:  Well developed, well nourished, no acute distress  Psych:  Alert and orientedx3,normal mood and affect HEENT:  Normocephalic, atraumatic, non-icteric sclera,  supple neck without adenopathy, mass or thyromegaly Cardiovascular:  Normal S1, S2, RRR without gallop, rub or murmur Respiratory:  Good breath sounds bilaterally, CTAB with normal respiratory effort Gastrointestinal: normal bowel sounds, soft, non-tender, no noted masses. No HSM MSK: no deformities, contusions. Joints are without erythema or swelling.  Skin:  Warm, no rashes or suspicious lesions noted Neurologic:    Mental status is normal. CN 2-11 are normal. Gross motor and sensory exams are normal. Normal gait. No tremor   Commons side effects, risks, benefits, and alternatives for medications and treatment plan prescribed today were discussed, and the patient  expressed understanding of the given instructions. Patient is instructed to call or message via MyChart if he/she has any questions or concerns regarding our treatment plan. No barriers to understanding were identified. We discussed Red Flag symptoms and signs in detail. Patient expressed understanding regarding what to do in case of urgent or emergency  type symptoms.  Medication list was reconciled, printed and provided to the patient in AVS. Patient instructions and summary information was reviewed with the patient as documented in the AVS. This note was prepared with assistance of Dragon voice recognition software. Occasional wrong-word or sound-a-like substitutions may have occurred due to the inherent limitations of voice recognition software

## 2022-06-08 NOTE — Patient Instructions (Signed)
Please return in 6 months for hypertension follow up.   Consider getting a flu shot and your shingrix vaccinations.   I will release your lab results to you on your MyChart account with further instructions. You may see the results before I do, but when I review them I will send you a message with my report or have my assistant call you if things need to be discussed. Please reply to my message with any questions. Thank you!   If you have any questions or concerns, please don't hesitate to send me a message via MyChart or call the office at [phone]. Thank you for visiting with [place] today! It's our pleasure caring for you.   Exercise to Lose Weight Exercise and a healthy diet may help you lose weight. Your doctor may suggest specific exercises. EXERCISE IDEAS AND TIPS Choose low-cost things you enjoy doing, such as walking, bicycling, or exercising to workout videos. Take stairs instead of the elevator. Walk during your lunch break. Park your car further away from work or school. Go to a gym or an exercise class. Start with 5 to 10 minutes of exercise each day. Build up to 30 minutes of exercise 4 to 6 days a week. Wear shoes with good support and comfortable clothes. Stretch before and after working out. Work out until you breathe harder and your heart beats faster. Drink extra water when you exercise. Do not do so much that you hurt yourself, feel dizzy, or get very short of breath. Exercises that burn about 150 calories: Running 1  miles in 15 minutes. Playing volleyball for 45 to 60 minutes. Washing and waxing a car for 45 to 60 minutes. Playing touch football for 45 minutes. Walking 1  miles in 35 minutes. Pushing a stroller 1  miles in 30 minutes. Playing basketball for 30 minutes. Raking leaves for 30 minutes. Bicycling 5 miles in 30 minutes. Walking 2 miles in 30 minutes. Dancing for 30 minutes. Shoveling snow for 15 minutes. Swimming laps for 20 minutes. Walking up  stairs for 15 minutes. Bicycling 4 miles in 15 minutes. Gardening for 30 to 45 minutes. Jumping rope for 15 minutes. Washing windows or floors for 45 to 60 minutes.

## 2022-06-09 LAB — COMPREHENSIVE METABOLIC PANEL
ALT: 27 U/L (ref 0–35)
AST: 20 U/L (ref 0–37)
Albumin: 4.4 g/dL (ref 3.5–5.2)
Alkaline Phosphatase: 84 U/L (ref 39–117)
BUN: 12 mg/dL (ref 6–23)
CO2: 30 mEq/L (ref 19–32)
Calcium: 9.9 mg/dL (ref 8.4–10.5)
Chloride: 103 mEq/L (ref 96–112)
Creatinine, Ser: 0.93 mg/dL (ref 0.40–1.20)
GFR: 66.91 mL/min (ref >60.00)
Glucose, Bld: 87 mg/dL (ref 70–99)
Potassium: 4.7 mEq/L (ref 3.5–5.1)
Sodium: 140 mEq/L (ref 135–145)
Total Bilirubin: 0.5 mg/dL (ref 0.2–1.2)
Total Protein: 7.5 g/dL (ref 6.0–8.3)

## 2022-06-09 LAB — CBC WITH DIFFERENTIAL/PLATELET
Basophils Absolute: 0.1 10*3/uL (ref 0.0–0.1)
Basophils Relative: 0.9 % (ref 0.0–3.0)
Eosinophils Absolute: 0 10*3/uL (ref 0.0–0.7)
Eosinophils Relative: 0.5 % (ref 0.0–5.0)
HCT: 39.7 % (ref 36.0–46.0)
Hemoglobin: 13.8 g/dL (ref 12.0–15.0)
Lymphocytes Relative: 30.4 % (ref 12.0–46.0)
Lymphs Abs: 2.4 10*3/uL (ref 0.7–4.0)
MCHC: 34.8 g/dL (ref 30.0–36.0)
MCV: 84.6 fl (ref 78.0–100.0)
Monocytes Absolute: 0.6 10*3/uL (ref 0.1–1.0)
Monocytes Relative: 7.8 % (ref 3.0–12.0)
Neutro Abs: 4.7 10*3/uL (ref 1.4–7.7)
Neutrophils Relative %: 60.4 % (ref 43.0–77.0)
Platelets: 335 10*3/uL (ref 150.0–400.0)
RBC: 4.69 Mil/uL (ref 3.87–5.11)
RDW: 13.3 % (ref 11.5–15.5)
WBC: 7.8 10*3/uL (ref 4.0–10.5)

## 2022-06-09 LAB — HEMOGLOBIN A1C: Hgb A1c MFr Bld: 6.1 % (ref 4.6–6.5)

## 2022-06-09 LAB — TSH: TSH: 1.21 u[IU]/mL (ref 0.35–5.50)

## 2022-06-09 LAB — LIPID PANEL
Cholesterol: 154 mg/dL (ref 0–200)
HDL: 47.2 mg/dL (ref >39.00)
LDL Cholesterol: 73 mg/dL (ref 0–99)
NonHDL: 106.38
Total CHOL/HDL Ratio: 3
Triglycerides: 167 mg/dL — ABNORMAL HIGH (ref 0.0–149.0)
VLDL: 33.4 mg/dL (ref 0.0–40.0)

## 2022-07-08 ENCOUNTER — Ambulatory Visit
Admission: EM | Admit: 2022-07-08 | Discharge: 2022-07-08 | Disposition: A | Discharge status: Home / Self Care | Payer: BC Managed Care – PPO | Attending: Nurse Practitioner | Admitting: Nurse Practitioner

## 2022-07-08 DIAGNOSIS — E782 Mixed hyperlipidemia: Secondary | ICD-10-CM | POA: Diagnosis not present

## 2022-07-08 DIAGNOSIS — I10 Essential (primary) hypertension: Secondary | ICD-10-CM | POA: Insufficient documentation

## 2022-07-08 DIAGNOSIS — Z1152 Encounter for screening for COVID-19: Secondary | ICD-10-CM | POA: Diagnosis not present

## 2022-07-08 DIAGNOSIS — R051 Acute cough: Secondary | ICD-10-CM | POA: Diagnosis not present

## 2022-07-08 DIAGNOSIS — J029 Acute pharyngitis, unspecified: Secondary | ICD-10-CM | POA: Insufficient documentation

## 2022-07-08 LAB — POCT RAPID STREP A (OFFICE): Rapid Strep A Screen: NEGATIVE

## 2022-07-08 MED ORDER — IBUPROFEN 800 MG PO TABS
800.0000 mg | ORAL_TABLET | Freq: Once | ORAL | Status: AC
Start: 2022-07-08 — End: 2022-07-08
  Administered 2022-07-08: 800 mg via ORAL

## 2022-07-08 MED ORDER — OSELTAMIVIR PHOSPHATE 75 MG PO CAPS: 75.0000 mg | ORAL_CAPSULE | Freq: Two times a day (BID) | ORAL | 0 refills | Status: DC

## 2022-07-08 NOTE — Discharge Instructions (Addendum)
Your COVID is pending. Your results will show in your MyChart. Any positive results will result in a phone call from a nurse with next steps in treatment and recommendations.  Your Strep Test is negative. You do have signs of influenza testing is not possible due to a supply deficit. We are going to treat you based on your symptoms. Tamiflu 75 mg twice a day for 5 days.  We encourage conservative treatment with symptom relief. We encourage you to use Tylenol for your pain (Remember to use as directed do not exceed daily dosing recommendations). We also encourage salt water gargles for your sore throat. You should also consider throat lozenges and chloraseptic spray.

## 2022-07-08 NOTE — ED Provider Notes (Signed)
[facility]    CSN: [id number] Arrival date & time: 07/08/22  1507      History   Chief Complaint No chief complaint on file.   HPI Rachel Collier is a 62 y.o. female.   HPI  She is in today with sore throat, fever, chills and body aches for 1.5 days. She works from home but did attend an outside event for [facility]. She has not sure of temp numbers. She did perform a home COVID test with an expired home test which was negative. She has a decreased appetite and has not hydrated well.  Denies nasal congestion, sneezing, runny nose, shortness of breath, chest pain, nausea, or diarrhea. Past Medical History:  Diagnosis Date   Diverticulosis 04/21/2021   By colonoscopy 2015, Dr. Oneida Alar.    Essential hypertension 04/21/2021   Hyperlipidemia    Hypertension    Internal hemorrhoids 04/21/2021   By colonoscopy 2015   Mixed hyperlipidemia 04/22/2021   Started statin 03/2021   Vertigo     Patient Active Problem List   Diagnosis Date Noted   Mixed hyperlipidemia 04/22/2021   IFG (impaired fasting glucose) 04/21/2021   Essential hypertension 04/21/2021   Uterine leiomyoma 04/21/2021   Diverticulosis 04/21/2021   Internal hemorrhoids 04/21/2021   GERD (gastroesophageal reflux disease) 11/16/2013    Past Surgical History:  Procedure Laterality Date   CESAREAN SECTION     COLONOSCOPY N/A 07/31/2013   CZY:SAYTKZ mucosa in the terminal ileum/Mild diverticulosis in the sigmoid colon/Moderate sized internal hemorrhoids   Cyst removed from left foot     ESOPHAGOGASTRODUODENOSCOPY N/A 11/24/2013   Dr. Oneida Alar: 1.  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 Few gastric polyps on the greater curvature of the gastric body, 3. MILD Non-erosive gastritis. negative H.pylori   Right bunionectomy     SAVORY DILATION N/A 11/24/2013   Procedure: SAVORY DILATION;  Surgeon: Danie Binder, MD;  [facility]: [facility];  Service: Endoscopy;  Laterality: N/A;    OB  History   No obstetric history on file.      Home Medications    Prior to Admission medications   Medication Sig Start Date End Date Taking? Authorizing Provider  oseltamivir (TAMIFLU) 75 MG capsule Take 1 capsule (75 mg total) by mouth every 12 (twelve) hours. 07/08/22  Yes Jesusa Stenerson, Diona Foley, NP  amLODipine-benazepril (LOTREL) 10-20 MG capsule TAKE 1 CAPSULE BY MOUTH EVERY DAY 06/02/22   Leamon Arnt, MD  atorvastatin (LIPITOR) 10 MG tablet TAKE 1 TABLET BY MOUTH EVERY DAY 05/18/22   Leamon Arnt, MD  Cholecalciferol (VITAMIN D3) 125 MCG (5000 UT) TABS Take 2 tablets by mouth daily at 12 noon.    [provider]    Family History Family History  Problem Relation Age of Onset   Hypertension Mother    Hyperlipidemia Mother    Kidney disease Father    Diabetes Father    Hypertension Father    Hypertension Sister    Hypertension Brother    Prostate cancer Son    Prostate cancer Son    Colon cancer Neg Hx    Stroke Neg Hx    Dementia Neg Hx     Social History Social History   Tobacco Use   Smoking status: Never   Smokeless tobacco: Never  Substance Use Topics   Alcohol use: No   Drug use: No     Allergies   Patient has no known allergies.   Review of Systems  Review of Systems   Physical Exam Triage Vital Signs ED Triage Vitals  Enc Vitals Group     BP 07/08/22 1511 122/71     Pulse Rate 07/08/22 1511 (!) 102     Resp 07/08/22 1511 20     Temp 07/08/22 1511 (!) 100.5 F (38.1 C)     Temp Source 07/08/22 1511 Oral     SpO2 07/08/22 1511 95 %     Weight --      Height --      Head Circumference --      Peak Flow --      Pain Score 07/08/22 1515 7     Pain Loc --      Pain Edu? --      Excl. in [place]? --    No data found.  Updated Vital Signs BP 122/71 (BP Location: Right Arm)   Pulse (!) 102   Temp (!) 100.5 F (38.1 C) (Oral)   Resp 20   SpO2 95%   Visual Acuity Right Eye Distance:   Left Eye Distance:   Bilateral Distance:     Right Eye Near:   Left Eye Near:    Bilateral Near:     Physical Exam Constitutional:      General: She is not in acute distress. HENT:     Head: Normocephalic and atraumatic.     Right Ear: Tympanic membrane normal.     Left Ear: Tympanic membrane normal.     Nose: Nose normal.     Mouth/Throat:     Mouth: Mucous membranes are dry.     Pharynx: No posterior oropharyngeal erythema.  Eyes:     Pupils: Pupils are equal, round, and reactive to light.  Cardiovascular:     Rate and Rhythm: Normal rate and regular rhythm.     Pulses: Normal pulses.     Heart sounds: Normal heart sounds.  Pulmonary:     Effort: Pulmonary effort is normal.     Breath sounds: Normal breath sounds. No stridor. No wheezing, rhonchi or rales.     Comments: Coarse breath sound to left with expiration  Musculoskeletal:     Cervical back: Normal range of motion.  Skin:    General: Skin is warm.     Capillary Refill: Capillary refill takes less than 2 seconds.  Neurological:     General: No focal deficit present.     Mental Status: She is alert and oriented to person, place, and time.  Psychiatric:        Mood and Affect: Mood normal.        Behavior: Behavior normal.      UC Treatments / Results  Labs (all labs ordered are listed, but only abnormal results are displayed) Labs Reviewed  SARS CORONAVIRUS 2 (TAT 6-24 HRS)  POCT RAPID STREP A (OFFICE)  POCT RAPID STREP A (OFFICE)    EKG   Radiology No results found.  Procedures Procedures (including critical care time)  Medications Ordered in UC Medications  ibuprofen (ADVIL) tablet 800 mg (800 mg Oral Given 07/08/22 1519)    Initial Impression / Assessment and Plan / UC Course  I have reviewed the triage vital signs and the nursing notes.  Pertinent labs & imaging results that were available during my care of the patient were reviewed by me and considered in my medical decision making (see chart for details).     Sore  throat Final Clinical Impressions(s) / UC Diagnoses   Final  diagnoses:  Sore throat  Acute cough     Discharge Instructions      Your COVID is pending. Your results will show in your MyChart. Any positive results will result in a phone call from a nurse with next steps in treatment and recommendations.  Your Strep Test is negative. You do have signs of influenza testing is not possible due to a supply deficit. We are going to treat you based on your symptoms. Tamiflu 75 mg twice a day for 5 days.  We encourage conservative treatment with symptom relief. We encourage you to use Tylenol for your pain (Remember to use as directed do not exceed daily dosing recommendations). We also encourage salt water gargles for your sore throat. You should also consider throat lozenges and chloraseptic spray.          ED Prescriptions     Medication Sig Dispense Auth. Provider   oseltamivir (TAMIFLU) 75 MG capsule Take 1 capsule (75 mg total) by mouth every 12 (twelve) hours. 10 capsule Vevelyn Francois, NP      PDMP not reviewed this encounter.   Dionisio David [place], [place] 07/08/22 [phone]

## 2022-07-08 NOTE — ED Triage Notes (Signed)
Pt reports feeling achy, loss of taste, fever, slight wheezing, headache, coughing and throat hurts x 3 days.

## 2022-07-09 LAB — SARS CORONAVIRUS 2 (TAT 6-24 HRS): SARS Coronavirus 2: NEGATIVE

## 2022-09-24 DIAGNOSIS — H43811 Vitreous degeneration, right eye: Secondary | ICD-10-CM | POA: Diagnosis not present

## 2022-09-24 DIAGNOSIS — H5213 Myopia, bilateral: Secondary | ICD-10-CM | POA: Diagnosis not present

## 2022-09-24 DIAGNOSIS — H04123 Dry eye syndrome of bilateral lacrimal glands: Secondary | ICD-10-CM | POA: Diagnosis not present

## 2022-09-24 DIAGNOSIS — H524 Presbyopia: Secondary | ICD-10-CM | POA: Diagnosis not present

## 2022-09-24 DIAGNOSIS — H2513 Age-related nuclear cataract, bilateral: Secondary | ICD-10-CM | POA: Diagnosis not present

## 2023-06-14 ENCOUNTER — Telehealth: Payer: Self-pay

## 2023-06-14 MED ORDER — AMLODIPINE BESY-BENAZEPRIL HCL 10-20 MG PO CAPS: 1.0000 | ORAL_CAPSULE | Freq: Every day | ORAL | 3 refills | Status: DC

## 2023-06-14 MED ORDER — ATORVASTATIN CALCIUM 10 MG PO TABS: 10.0000 mg | ORAL_TABLET | Freq: Every day | ORAL | 3 refills | Status: DC

## 2023-06-14 NOTE — Telephone Encounter (Signed)
Copied from CRM [id number]. Topic: Clinical - Medication Refill >> Jun 14, 2023 12:35 PM Orinda Kenner C wrote: Most Recent Primary Care Visit:  Provider: Willow Ora  Department: [facility]  Visit Type: PHYSICAL  Date: 06/08/2022  Medication: amLODipine-benazepril (LOTREL) 10-20 MG capsule  atorvastatin (LIPITOR) 10 MG tablet   Has the patient contacted their pharmacy? Yes (Agent: If no, request that the patient contact the pharmacy for the refill. If patient does not wish to contact the pharmacy document the reason why and proceed with request.) (Agent: If yes, when and what did the pharmacy advise?)  Is this the correct pharmacy for this prescription? Yes If no, delete pharmacy and type the correct one.  This is the patient's preferred pharmacy:  [id number] - [place], [place] - [address] [place] [address] Phone: [phone] Fax: [phone]   Has the prescription been filled recently?   Is the patient out of the medication? Yes   Rx sent to requested pharmacy.  Has the patient been seen for an appointment in the last year OR does the patient have an upcoming appointment? Yes  Can we respond through MyChart? No, pls c/b [phone]  Agent: Please be advised that Rx refills may take up to 3 business days. We ask that you follow-up with your pharmacy.

## 2023-07-01 ENCOUNTER — Encounter: Payer: Self-pay | Admitting: *Deleted

## 2023-07-14 ENCOUNTER — Encounter: Payer: BC Managed Care – PPO | Admitting: Family Medicine

## 2023-08-11 ENCOUNTER — Encounter: Payer: BC Managed Care – PPO | Admitting: Family Medicine

## 2023-08-25 ENCOUNTER — Ambulatory Visit: Payer: BC Managed Care – PPO | Admitting: Family Medicine

## 2023-08-25 ENCOUNTER — Encounter: Payer: Self-pay | Admitting: Family Medicine

## 2023-08-25 VITALS — BP 130/80 | HR 71 | Temp 98.1°F | Ht 66.0 in | Wt 187.0 lb

## 2023-08-25 DIAGNOSIS — Z23 Encounter for immunization: Secondary | ICD-10-CM | POA: Diagnosis not present

## 2023-08-25 DIAGNOSIS — R7301 Impaired fasting glucose: Secondary | ICD-10-CM

## 2023-08-25 DIAGNOSIS — K219 Gastro-esophageal reflux disease without esophagitis: Secondary | ICD-10-CM | POA: Diagnosis not present

## 2023-08-25 DIAGNOSIS — I10 Essential (primary) hypertension: Secondary | ICD-10-CM | POA: Diagnosis not present

## 2023-08-25 DIAGNOSIS — Z0000 Encounter for general adult medical examination without abnormal findings: Secondary | ICD-10-CM

## 2023-08-25 DIAGNOSIS — Z1211 Encounter for screening for malignant neoplasm of colon: Secondary | ICD-10-CM

## 2023-08-25 DIAGNOSIS — E782 Mixed hyperlipidemia: Secondary | ICD-10-CM

## 2023-08-25 DIAGNOSIS — Z0001 Encounter for general adult medical examination with abnormal findings: Secondary | ICD-10-CM

## 2023-08-25 LAB — LIPID PANEL
Cholesterol: 154 mg/dL (ref 0–200)
HDL: 47.3 mg/dL (ref >39.00)
LDL Cholesterol: 76 mg/dL (ref 0–99)
NonHDL: 107.15
Total CHOL/HDL Ratio: 3
Triglycerides: 155 mg/dL — ABNORMAL HIGH (ref 0.0–149.0)
VLDL: 31 mg/dL (ref 0.0–40.0)

## 2023-08-25 LAB — HEMOGLOBIN A1C: Hgb A1c MFr Bld: 6.2 % (ref 4.6–6.5)

## 2023-08-25 LAB — COMPREHENSIVE METABOLIC PANEL
ALT: 39 U/L — ABNORMAL HIGH (ref 0–35)
AST: 26 U/L (ref 0–37)
Albumin: 4.5 g/dL (ref 3.5–5.2)
Alkaline Phosphatase: 79 U/L (ref 39–117)
BUN: 13 mg/dL (ref 6–23)
CO2: 31 meq/L (ref 19–32)
Calcium: 9.6 mg/dL (ref 8.4–10.5)
Chloride: 103 meq/L (ref 96–112)
Creatinine, Ser: 0.8 mg/dL (ref 0.40–1.20)
GFR: 79.48 mL/min (ref >60.00)
Glucose, Bld: 89 mg/dL (ref 70–99)
Potassium: 3.8 meq/L (ref 3.5–5.1)
Sodium: 140 meq/L (ref 135–145)
Total Bilirubin: 0.4 mg/dL (ref 0.2–1.2)
Total Protein: 7.9 g/dL (ref 6.0–8.3)

## 2023-08-25 LAB — TSH: TSH: 1.35 u[IU]/mL (ref 0.35–5.50)

## 2023-08-25 NOTE — Progress Notes (Signed)
 Subjective  Chief Complaint  Patient presents with   Annual Exam    Pt here for Annual Exam and is not currently fasting    Hypertension    HPI: Rachel Collier is a 63 y.o. female who presents to [facility] today for a Female Wellness Visit. She also has the concerns and/or needs as listed above in the chief complaint. These will be addressed in addition to the Health Maintenance Visit.   Wellness Visit: annual visit with health maintenance review and exam  HM: 63 year old female, happy working from home.  No new concerns.  Here for physical and follow-up.  Due OB/GYN visit.  Needs mammogram.  Pap smears are reviewed and up-to-date.  Due again in 2028.  Colonoscopy was last done in 2015 showing diverticulosis and internal hemorrhoids, Dr. Darrick Penna in [place].  She is no longer there.  Patient is now due and needs a new GI doctor.  No symptoms.  Average risk.  We discussed immunizations.  In the past she has deferred most immunizations.  She feels strongly still that she does not need the flu shot today.  She is eligible for Shingrix and COVID. Chronic disease f/u and/or acute problem visit: (deemed necessary to be done in addition to the wellness visit): Hypertension well-controlled with current medication.  No side effects.  Denies chest pain shortness of breath lower extremity edema.  Weight is stable. Hyperlipidemia on statin well-tolerated.  Nonfasting for recheck today. Impaired fasting glucose by history.  No symptoms of hyperglycemia.  Assessment  1. Encounter for well adult exam with abnormal findings   2. Essential hypertension   3. Gastroesophageal reflux disease, unspecified whether esophagitis present   4. Mixed hyperlipidemia   5. IFG (impaired fasting glucose)   6. Screening for colorectal cancer      Plan  Female Wellness Visit: Age appropriate Health Maintenance and Prevention measures were discussed with patient. Included topics are  cancer screening recommendations, ways to keep healthy (see AVS) including dietary and exercise recommendations, regular eye and dental care, use of seat belts, and avoidance of moderate alcohol use and tobacco use.  Patient to schedule mammogram.  Referral to GI, [facility] for colonoscopy BMI: discussed patient's BMI and encouraged positive lifestyle modifications to help get to or maintain a target BMI. HM needs and immunizations were addressed and ordered. See below for orders. See HM and immunization section for updates.  Education and counseling regarding Shingrix.  First Shingrix given today.  Return in 6 months for second Routine labs and screening tests ordered including cmp, cbc and lipids where appropriate. Discussed recommendations regarding Vit D and calcium supplementation (see AVS)  Chronic disease management visit and/or acute problem visit: Hypertension is well-controlled.  Lotrel 1020 daily.  Check renal function electrolytes. Recheck lipids on Lipitor 10.  Check LFTs.  Well-tolerated. A1c.  Discussed diet. GERD is well-controlled.  Rare symptoms. Follow up: 1 year for complete physical Orders Placed This Encounter  Procedures   HM PAP SMEAR   CBC with Differential/Platelet   Comprehensive metabolic panel   Lipid panel   Hemoglobin A1c   TSH   Ambulatory referral to Gastroenterology   No orders of the defined types were placed in this encounter.     Body mass index is 30.18 kg/m. Wt Readings from Last 3 Encounters:  08/25/23 187 lb (84.8 kg)  06/08/22 187 lb 3.2 oz (84.9 kg)  02/11/22 185 lb 3.2 oz (84 kg)     Patient  Active Problem List   Diagnosis Date Noted Date Diagnosed   Mixed hyperlipidemia 04/22/2021     Started statin 03/2021    IFG (impaired fasting glucose) 04/21/2021    Essential hypertension 04/21/2021    Uterine leiomyoma 04/21/2021    Diverticulosis 04/21/2021     By colonoscopy 2015, Dr. Darrick Penna.     Internal hemorrhoids 04/21/2021     By  colonoscopy 2015    GERD (gastroesophageal reflux disease) 11/16/2013    Health Maintenance  Topic Date Due   MAMMOGRAM  07/06/2022   Colonoscopy  08/01/2023   COVID-19 Vaccine (3 - 2024-25 season) 09/10/2023 (Originally 02/21/2023)   INFLUENZA VACCINE  09/20/2023 (Originally 01/21/2023)   Zoster Vaccines- Shingrix (1 of 2) 11/25/2023 (Originally 02/26/2012)   Cervical Cancer Screening (HPV/Pap Cotest)  07/14/2026   DTaP/Tdap/Td (2 - Td or Tdap) 05/20/2030   Hepatitis C Screening  Completed   HPV VACCINES  Aged Out   HIV Screening  Discontinued   Immunization History  Administered Date(s) Administered   Moderna Sars-Covid-2 Vaccination 11/04/2019, 12/05/2019   Tdap 05/20/2020   We updated and reviewed the patient's past history in detail and it is documented below. Allergies: Patient has no known allergies. Past Medical History Patient  has a past medical history of Diverticulosis (04/21/2021), Essential hypertension (04/21/2021), Hyperlipidemia, Hypertension, Internal hemorrhoids (04/21/2021), Mixed hyperlipidemia (04/22/2021), and Vertigo. Past Surgical History Patient  has a past surgical history that includes Cesarean section; Right bunionectomy; Cyst removed from left foot; Colonoscopy (N/A, 07/31/2013); Esophagogastroduodenoscopy (N/A, 11/24/2013); and Savory dilation (N/A, 11/24/2013). Family History: Patient family history includes Diabetes in her father; Hyperlipidemia in her mother; Hypertension in her brother, father, mother, and sister; Kidney disease in her father; Prostate cancer in her son and son. Social History:  Patient  reports that she has never smoked. She has never used smokeless tobacco. She reports that she does not drink alcohol and does not use drugs.  Review of Systems: Constitutional: negative for fever or malaise Ophthalmic: negative for photophobia, double vision or loss of vision Cardiovascular: negative for chest pain, dyspnea on exertion, or new LE  swelling Respiratory: negative for SOB or persistent cough Gastrointestinal: negative for abdominal pain, change in bowel habits or melena Genitourinary: negative for dysuria or gross hematuria, no abnormal uterine bleeding or disharge Musculoskeletal: negative for new gait disturbance or muscular weakness Integumentary: negative for new or persistent rashes, no breast lumps Neurological: negative for TIA or stroke symptoms Psychiatric: negative for SI or delusions Allergic/Immunologic: negative for hives  Patient Care Team    Relationship Specialty Notifications Start End  Willow Ora, MD PCP - General Family Medicine  04/21/21   West Bali, MD (Inactive) Consulting Physician Gastroenterology  06/20/14     Objective  Vitals: BP 130/80   Pulse 71   Temp 98.1 F (36.7 C)   Ht 5\' 6"  (1.676 m)   Wt 187 lb (84.8 kg)   SpO2 96%   BMI 30.18 kg/m  General:  Well developed, well nourished, no acute distress  Psych:  Alert and orientedx3,normal mood and affect HEENT:  Normocephalic, atraumatic, non-icteric sclera,  supple neck without adenopathy, mass or thyromegaly Cardiovascular:  Normal S1, S2, RRR without gallop, rub or murmur Respiratory:  Good breath sounds bilaterally, CTAB with normal respiratory effort Gastrointestinal: normal bowel sounds, soft, non-tender, no noted masses. No HSM MSK: extremities without edema, joints without erythema or swelling Neurologic:    Mental status is normal.  Gross motor and sensory exams are normal.  No tremor  Commons side effects, risks, benefits, and alternatives for medications and treatment plan prescribed today were discussed, and the patient expressed understanding of the given instructions. Patient is instructed to call or message via MyChart if he/she has any questions or concerns regarding our treatment plan. No barriers to understanding were identified. We discussed Red Flag symptoms and signs in detail. Patient expressed  understanding regarding what to do in case of urgent or emergency type symptoms.  Medication list was reconciled, printed and provided to the patient in AVS. Patient instructions and summary information was reviewed with the patient as documented in the AVS. This note was prepared with assistance of Dragon voice recognition software. Occasional wrong-word or sound-a-like substitutions may have occurred due to the inherent limitations of voice recognition software

## 2023-08-25 NOTE — Patient Instructions (Addendum)
 Please return in 12 months for your annual complete physical; please come fasting.   I will release your lab results to you on your MyChart account with further instructions. You may see the results before I do, but when I review them I will send you a message with my report or have my assistant call you if things need to be discussed. Please reply to my message with any questions. Thank you!   If you have any questions or concerns, please don't hesitate to send me a message via MyChart or call the office at [phone]. Thank you for visiting with [place] today! It's our pleasure caring for you.   VISIT SUMMARY:  JENNIER Collier, a 63 year old female, visited for a routine follow-up and medication refill. Her hypertension and hyperlipidemia are well-controlled with her current medications. We discussed her upcoming screenings and vaccinations, and she agreed to receive the shingles vaccine today. Routine labs were ordered, and we discussed her options for a colonoscopy and mammogram.  YOUR PLAN:  -HYPERTENSION AND HYPERLIPIDEMIA: Hypertension is high blood pressure, and hyperlipidemia is high cholesterol. Both conditions are stable with your current medications, and you should continue taking them as prescribed.  -COLONOSCOPY: A colonoscopy is a screening test for colon cancer. You are due for this screening, and we will refer you to a local gastroenterology provider since your previous doctor is no longer available.  -MAMMOGRAM: A mammogram is an X-ray of the breast used to screen for breast cancer. You are due for this screening, and you should schedule it with your OB/GYN.  -VACCINATIONS: Vaccinations help protect against certain diseases. You declined the flu vaccine but agreed to receive the shingles vaccine today, which can help prevent shingles, a painful rash caused by the varicella-zoster virus.  -LAB WORK: Routine lab work helps monitor your overall health. We have ordered tests for  your A1C (which measures blood sugar levels), liver function, and lipid panel (which measures cholesterol levels).  INSTRUCTIONS:  Please schedule your mammogram with your OB/GYN and follow up with the local gastroenterology provider for your colonoscopy. Continue taking your current medications for hypertension and hyperlipidemia. We will contact you with the results of your lab work once they are available.  For more information, you can read your full clinical note, available in your patient portal.

## 2023-08-26 LAB — CBC WITH DIFFERENTIAL/PLATELET
Basophils Absolute: 0 10*3/uL (ref 0.0–0.1)
Basophils Relative: 0.3 % (ref 0.0–3.0)
Eosinophils Absolute: 0 10*3/uL (ref 0.0–0.7)
Eosinophils Relative: 0.4 % (ref 0.0–5.0)
HCT: 40.4 % (ref 36.0–46.0)
Hemoglobin: 13.7 g/dL (ref 12.0–15.0)
Lymphocytes Relative: 26.9 % (ref 12.0–46.0)
Lymphs Abs: 2.1 10*3/uL (ref 0.7–4.0)
MCHC: 34 g/dL (ref 30.0–36.0)
MCV: 85.8 fl (ref 78.0–100.0)
Monocytes Absolute: 0.5 10*3/uL (ref 0.1–1.0)
Monocytes Relative: 6.8 % (ref 3.0–12.0)
Neutro Abs: 5.1 10*3/uL (ref 1.4–7.7)
Neutrophils Relative %: 65.6 % (ref 43.0–77.0)
Platelets: 331 10*3/uL (ref 150.0–400.0)
RBC: 4.7 Mil/uL (ref 3.87–5.11)
RDW: 13.6 % (ref 11.5–15.5)
WBC: 7.7 10*3/uL (ref 4.0–10.5)

## 2023-08-30 ENCOUNTER — Encounter: Payer: Self-pay | Admitting: Family Medicine

## 2023-08-30 NOTE — Progress Notes (Signed)
 See mychart note Dear Ms. Carrero, Your lab results look good overall.  Your A1c remains in the early prediabetic range. We will monitor this over time. Please keep to a low sugar, low carb diet to prevent it from worsening.  One of your liver tests, ALT is just above normal. I am not concerned. Avoid taking regular tylenol and alcohol. Your cholesterol remains well controlled on the medication.  No changes are needed at this time.  Take care. Sincerely, Dr. Mardelle Matte

## 2023-10-26 ENCOUNTER — Ambulatory Visit

## 2023-11-09 ENCOUNTER — Ambulatory Visit: Payer: Self-pay

## 2023-11-09 NOTE — Telephone Encounter (Signed)
 Copied from CRM [id number]. Topic: Clinical - Red Word Triage >> Nov 09, 2023 10:04 AM Jenna Moan wrote: Red Word that prompted transfer to Nurse Triage: Red Eye think it might be conjunctivitis and what's to know what she can take over the counter    Chief Complaint: Has had some cold symptoms. Eye draining, red, mild pain. Symptoms: above Frequency: this week Pertinent Negatives: Patient denies  Disposition: [] ED /[] Urgent Care (no appt availability in office) / [x] Appointment(In office/virtual)/ []  [facility]/ [] Home Care/ [] Refused Recommended Disposition /[] [facility]/ []  Follow-up with PCP Additional Notes: agrees  Reason for Disposition  Eye pain present > 24 hours  Answer Assessment - Initial Assessment Questions 1. ONSET: "When did the pain start?" (e.g., minutes, hours, days)     This week 2. TIMING: "Does the pain come and go, or has it been constant since it started?" (e.g., constant, intermittent, fleeting)     Comes and goes 3. SEVERITY: "How bad is the pain?"   (Scale 1-10; mild, moderate or severe)   - MILD (1-3): doesn't interfere with normal activities    - MODERATE (4-7): interferes with normal activities or awakens from sleep    - SEVERE (8-10): excruciating pain and patient unable to do normal activities     3 4. LOCATION: "Where does it hurt?"  (e.g., eyelid, eye, cheekbone)     Red, corner of eye 5. CAUSE: "What do you think is causing the pain?"     Pink eye 6. VISION: "Do you have blurred vision or changes in your vision?"      No 7. EYE DISCHARGE: "Is there any discharge (pus) from the eye(s)?"  If Yes, ask: "What color is it?"      Small 8. FEVER: "Do you have a fever?" If Yes, ask: "What is it, how was it measured, and when did it start?"      no 9. OTHER SYMPTOMS: "Do you have any other symptoms?" (e.g., headache, nasal discharge, facial rash)     Congestion 10. PREGNANCY: "Is there any chance you are pregnant?" "When was your  last menstrual period?"       no  Protocols used: Eye Pain and Other Symptoms-A-AH

## 2023-11-09 NOTE — Telephone Encounter (Signed)
 Appt tomorrow.

## 2023-11-10 ENCOUNTER — Ambulatory Visit (INDEPENDENT_AMBULATORY_CARE_PROVIDER_SITE_OTHER): Admitting: Family Medicine

## 2023-11-10 ENCOUNTER — Encounter: Payer: Self-pay | Admitting: Family Medicine

## 2023-11-10 VITALS — BP 130/82 | HR 71 | Temp 97.7°F | Ht 66.0 in | Wt 187.2 lb

## 2023-11-10 DIAGNOSIS — Z23 Encounter for immunization: Secondary | ICD-10-CM

## 2023-11-10 DIAGNOSIS — H1031 Unspecified acute conjunctivitis, right eye: Secondary | ICD-10-CM

## 2023-11-10 DIAGNOSIS — J0100 Acute maxillary sinusitis, unspecified: Secondary | ICD-10-CM | POA: Diagnosis not present

## 2023-11-10 MED ORDER — AMOXICILLIN-POT CLAVULANATE 875-125 MG PO TABS: 1.0000 | ORAL_TABLET | Freq: Two times a day (BID) | ORAL | 0 refills | Status: AC

## 2023-11-10 MED ORDER — ERYTHROMYCIN 5 MG/GM OP OINT: 1.0000 | TOPICAL_OINTMENT | Freq: Three times a day (TID) | OPHTHALMIC | 0 refills | Status: AC

## 2023-11-10 NOTE — Patient Instructions (Addendum)
 Please follow up if symptoms do not improve or as needed.    Please schedule your mammogram and get an appointment with [facility] for colonoscopy evaluation.

## 2023-11-10 NOTE — Progress Notes (Signed)
 Subjective   CC:  Chief Complaint  Patient presents with   Sinus Problem    Eye has been closed with crust in it for the past 2 days and puffy and was hurting     HPI: Rachel Collier is a 63 y.o. female who presents to the office today to address the problems listed above in the chief complaint. Patient reports sinus congestion and pressure with thick drainage, mild nonproductive cough, ear pressure without pain, and mild malaise.  Symptoms have been present for several days.Shedenies high fevers, GI symptoms, shortness of breath.also with sore right red eye with crusting and drainage. Ongoing x 1.5 weeks and not improving.  Patient is a non-smoker.  No history of asthma or COPD.  I reviewed the patients updated PMH, FH, and SocHx.    Patient Active Problem List   Diagnosis Date Noted   Mixed hyperlipidemia 04/22/2021   IFG (impaired fasting glucose) 04/21/2021   Essential hypertension 04/21/2021   Uterine leiomyoma 04/21/2021   Diverticulosis 04/21/2021   Internal hemorrhoids 04/21/2021   GERD (gastroesophageal reflux disease) 11/16/2013   Current Meds  Medication Sig   amLODipine -benazepril  (LOTREL) 10-20 MG capsule Take 1 capsule by mouth daily.   atorvastatin  (LIPITOR) 10 MG tablet Take 1 tablet (10 mg total) by mouth daily.   Cholecalciferol (VITAMIN D3) 125 MCG (5000 UT) TABS Take 2 tablets by mouth daily at 12 noon.   amoxicillin -clavulanate (AUGMENTIN ) 875-125 MG tablet Take 1 tablet by mouth 2 (two) times daily for 10 days.   erythromycin ophthalmic ointment Place 1 Application into the left eye 3 (three) times daily.    Review of Systems: Cardiovascular: negative for chest pain Respiratory: negative for SOB or persistent cough Gastrointestinal: negative for abdominal pain Genitourinary: negative for dysuria or gross hematuria  Objective  Vitals: BP 130/82   Pulse 71   Temp 97.7 F (36.5 C)   Ht 5\' 6"  (1.676 m)   Wt 187 lb 3.2 oz (84.9 kg)   SpO2 99%    BMI 30.21 kg/m  General: no acute distress  Psych:  Alert and oriented, normal mood and affect HEENT:  Normocephalic, atraumatic, right eye is minimally red, no crusting now, TMs with serous effusions or retraction w/o erythema, nasal mucosa is red with purulent drainage, tender maxillary sinus present, OP mild erythematous w/o eudate, supple neck without LAD Cardiovascular:  RRR without murmur or gallop. no peripheral edema Respiratory:  Good breath sounds bilaterally, CTAB with normal respiratory effort Skin:  Warm, no rashes Neurologic:   Mental status is normal. normal gait  Assessment  1. Acute non-recurrent maxillary sinusitis   2. Acute bacterial conjunctivitis of right eye   3. Need for shingles vaccine      Plan   Sinusitis: History and exam is most consistent with bacterial sinus infection.  Etiology and prognosis discussed with patient.  Recommend antibiotics as ordered below.  Patient to complete course of antibiotics, use supportive medications like mucolytics and decongestants as needed.  May use Tylenol or Advil  if needed.  Symptoms should improve over the next 2 weeks.  Patient will return or call if symptoms persist or worsen. Conjunctivitis/blepharitis: emycine ointment and warm compresses Shingles #2 given today  Follow up: prn   Commons side effects, risks, benefits, and alternatives for medications and treatment plan prescribed today were discussed, and the patient expressed understanding of the given instructions. Patient is instructed to call or message via MyChart if he/she has any questions or concerns regarding our  treatment plan. No barriers to understanding were identified. We discussed Red Flag symptoms and signs in detail. Patient expressed understanding regarding what to do in case of urgent or emergency type symptoms.  Medication list was reconciled, printed and provided to the patient in AVS. Patient instructions and summary information was reviewed with the  patient as documented in the AVS. This note was prepared with assistance of Dragon voice recognition software. Occasional wrong-word or sound-a-like substitutions may have occurred due to the inherent limitations of voice recognition software  Orders Placed This Encounter  Procedures   Zoster Recombinant (Shingrix  )   Meds ordered this encounter  Medications   erythromycin ophthalmic ointment    Sig: Place 1 Application into the left eye 3 (three) times daily.    Dispense:  3.5 g    Refill:  0   amoxicillin -clavulanate (AUGMENTIN ) 875-125 MG tablet    Sig: Take 1 tablet by mouth 2 (two) times daily for 10 days.    Dispense:  20 tablet    Refill:  0

## 2024-01-27 ENCOUNTER — Ambulatory Visit: Payer: Self-pay

## 2024-01-27 NOTE — Telephone Encounter (Signed)
 Noted

## 2024-01-27 NOTE — Telephone Encounter (Signed)
 FYI Only or Action Required?: FYI only for provider.  Patient was last seen in primary care on 11/10/2023 by Jodie Lavern CROME, MD.  Called Nurse Triage reporting Covid Positive.  Symptoms began several days ago.  Interventions attempted: Nothing.  Symptoms are: headache, sore throat, cough the same or slightly improved.  Triage Disposition: Home Care  Patient/caregiver understands and will follow disposition?: Yes              Copied from CRM [id number]. Topic: Clinical - Red Word Triage >> Jan 27, 2024  9:11 AM Larissa RAMAN wrote: [facility] that prompted transfer to Nurse Triage: headache-worsening, sore throat. States she has taken a Covid test and it is positive Reason for Disposition  [1] COVID-19 diagnosed by positive lab test (e.g., PCR, rapid self-test kit) AND [2] mild symptoms (e.g., cough, fever, others) AND [3] no complications or SOB  Answer Assessment - Initial Assessment Questions 1. COVID-19 DIAGNOSIS: How do you know that you have COVID? (e.g., positive lab test or self-test, diagnosed by doctor or NP/PA, symptoms after exposure).     Positive self test. (Today)  2. COVID-19 EXPOSURE: Was there any known exposure to COVID before the symptoms began? CDC Definition of close contact: within 6 feet (2 meters) for a total of 15 minutes or more over a 24-hour period.      Unsure, she states she went to a wedding last weekend.  3. ONSET: When did the COVID-19 symptoms start?      Tuesday August 5th.  4. WORST SYMPTOM: What is your worst symptom? (e.g., cough, fever, shortness of breath, muscle aches)     Headache, sore throat.  5. COUGH: Do you have a cough? If Yes, ask: How bad is the cough?       Yes, she states a little cough. She states it is not constant.  6. FEVER: Do you have a fever? If Yes, ask: What is your temperature, how was it measured, and when did it start?     No, she states she checked this morning and it was 99.  7. RESPIRATORY  STATUS: Describe your breathing? (e.g., normal; shortness of breath, wheezing, unable to speak)      Fine. She states she is not having any difficulties with breathing. She states she was able to exercise on Monday.  8. BETTER-SAME-WORSE: Are you getting better, staying the same or getting worse compared to yesterday?  If getting worse, ask, In what way?     She states her headaches are the same, and her sore throat is slightly improved from yesterday.  9. OTHER SYMPTOMS: Do you have any other symptoms?  (e.g., chills, fatigue, headache, loss of smell or taste, muscle pain, sore throat)     Fatigue. Patient denies body aches, chest pain, loss of taste or smell.  10. HIGH RISK DISEASE: Do you have any chronic medical problems? (e.g., asthma, heart or lung disease, weak immune system, obesity, etc.)       No.  11. VACCINE: Have you had the COVID-19 vaccine? If Yes, ask: Which one, how many shots, when did you get it?       Yes, 2 doses since it first came out.  12. PREGNANCY: Is there any chance you are pregnant? When was your last menstrual period?       N/A.  13. O2 SATURATION MONITOR:  Do you use an oxygen saturation monitor (pulse oximeter) at home? If Yes, ask What is your reading (oxygen level) today? What  is your usual oxygen saturation reading? (e.g., 95%)       No.  Protocols used: Coronavirus (COVID-19) Diagnosed or Suspected-A-AH

## 2024-01-28 ENCOUNTER — Telehealth: Payer: Self-pay | Admitting: Nurse Practitioner

## 2024-01-28 ENCOUNTER — Telehealth: Payer: Self-pay

## 2024-01-28 ENCOUNTER — Telehealth: Admitting: Nurse Practitioner

## 2024-01-28 DIAGNOSIS — U071 COVID-19: Secondary | ICD-10-CM

## 2024-01-28 MED ORDER — FLUTICASONE PROPIONATE 50 MCG/ACT NA SUSP: 2.0000 | Freq: Every day | NASAL | 6 refills | Status: AC

## 2024-01-28 MED ORDER — BENZONATATE 100 MG PO CAPS: 100.0000 mg | ORAL_CAPSULE | Freq: Three times a day (TID) | ORAL | 0 refills | Status: AC | PRN

## 2024-01-28 MED ORDER — NIRMATRELVIR/RITONAVIR (PAXLOVID)TABLET: 3.0000 | ORAL_TABLET | Freq: Two times a day (BID) | ORAL | 0 refills | Status: AC

## 2024-01-28 NOTE — Telephone Encounter (Signed)
 Copied from CRM [id number]. Topic: Clinical - Prescription Issue >> Jan 28, 2024  1:16 PM Jayma L wrote: Reason for CRM:   patient called and stated she was seen this morning at 1045am by video visit for having Covid 19. Said she was prescribed  Paxlovid  , said it cost way too much and insurance will only cover 300 dollars , asking if something else can be sent in for her instead. Please callback patient and let her know - the new medicine can be sent to [facility] as she has saved in her preferred pharmacy

## 2024-01-28 NOTE — Telephone Encounter (Signed)
 Calling back to discuss price of Paxlovid  with patient   Discussed with patient that Paxlovid  does not have a generic currently there is not a an alternative. Discussed continuing medications for symptom management and follow up if symptoms persist.

## 2024-01-28 NOTE — Progress Notes (Signed)
 Virtual Visit Consent   Rachel Collier, you are scheduled for a virtual visit with a [facility] provider today. Just as with appointments in the office, your consent must be obtained to participate. Your consent will be active for this visit and any virtual visit you may have with one of our providers in the next 365 days. If you have a MyChart account, a copy of this consent can be sent to you electronically.  As this is a virtual visit, video technology does not allow for your provider to perform a traditional examination. This may limit your provider's ability to fully assess your condition. If your provider identifies any concerns that need to be evaluated in person or the need to arrange testing (such as labs, EKG, etc.), we will make arrangements to do so. Although advances in technology are sophisticated, we cannot ensure that it will always work on either your end or our end. If the connection with a video visit is poor, the visit may have to be switched to a telephone visit. With either a video or telephone visit, we are not always able to ensure that we have a secure connection.  By engaging in this virtual visit, you consent to the provision of healthcare and authorize for your insurance to be billed (if applicable) for the services provided during this visit. Depending on your insurance coverage, you may receive a charge related to this service.  I need to obtain your verbal consent now. Are you willing to proceed with your visit today? Rachel Collier has provided verbal consent on 01/28/2024 for a virtual visit (video or telephone). Lauraine Kitty, FNP  Date: 01/28/2024 10:44 AM   Virtual Visit via Video Note   I, Lauraine Kitty, connected with  Rachel Collier  ([id number], 29-Aug-1960) on 01/28/24 at 10:45 AM EDT by a video-enabled telemedicine application and verified that I am speaking with the correct person using two identifiers.  Location: Patient: Virtual Visit Location  Patient: Home Provider: Virtual Visit Location Provider: Home Office   I discussed the limitations of evaluation and management by telemedicine and the availability of in person appointments. The patient expressed understanding and agreed to proceed.    History of Present Illness: Rachel Collier is a 63 y.o. who identifies as a female who was assigned female at birth, and is being seen today after testing positive for COVID-19   She started to feel sick yesterday. Symptoms include scratchy throat, headache, mild fever 100.1 yesterday- no fever yet today. She has had a dry cough and nasal congestion   She took a home COVID test that was positive  Most recent GFR from March was 52   She has had COVID in the past most recently 2 years ago. She did take an antiviral medication at that time. Tolerated anti-viral well with last episode and is interested in taking this medication again.    She has been vaccinated x2 against COVID   She does have a history of HTN   Problems:  Patient Active Problem List   Diagnosis Date Noted   Mixed hyperlipidemia 04/22/2021   IFG (impaired fasting glucose) 04/21/2021   Essential hypertension 04/21/2021   Uterine leiomyoma 04/21/2021   Diverticulosis 04/21/2021   Internal hemorrhoids 04/21/2021   GERD (gastroesophageal reflux disease) 11/16/2013    Allergies: No Known Allergies Medications:  Current Outpatient Medications:    amLODipine -benazepril  (LOTREL) 10-20 MG capsule, Take 1 capsule by mouth daily., Disp: 90 capsule, Rfl: 3   atorvastatin  (  LIPITOR) 10 MG tablet, Take 1 tablet (10 mg total) by mouth daily., Disp: 90 tablet, Rfl: 3   Cholecalciferol (VITAMIN D3) 125 MCG (5000 UT) TABS, Take 2 tablets by mouth daily at 12 noon., Disp: , Rfl:    erythromycin  ophthalmic ointment, Place 1 Application into the left eye 3 (three) times daily., Disp: 3.5 g, Rfl: 0  Observations/Objective: No labored breathing.  Speech is clear and coherent with  logical content.  Patient is alert and oriented at baseline.    Assessment and Plan:  1. COVID-19 (Primary)  Advised to stop atorvastatin  for the 5 days while she is on the anti-viral paxlovid    - nirmatrelvir /ritonavir  (PAXLOVID ) 20 x 150 MG & 10 x 100MG  TABS; Take 3 tablets by mouth 2 (two) times daily for 5 days. (Take nirmatrelvir  150 mg two tablets twice daily for 5 days and ritonavir  100 mg one tablet twice daily for 5 days) Patient GFR is 79  Dispense: 30 tablet; Refill: 0 - benzonatate  (TESSALON ) 100 MG capsule; Take 1 capsule (100 mg total) by mouth 3 (three) times daily as needed.  Dispense: 30 capsule; Refill: 0 - fluticasone  (FLONASE ) 50 MCG/ACT nasal spray; Place 2 sprays into both nostrils daily.  Dispense: 16 g; Refill: 6  Follow Up Instructions: I discussed the assessment and treatment plan with the patient. The patient was provided an opportunity to ask questions and all were answered. The patient agreed with the plan and demonstrated an understanding of the instructions.  A copy of instructions were sent to the patient via MyChart unless otherwise noted below.     The patient was advised to call back or seek an in-person evaluation if the symptoms worsen or if the condition fails to improve as anticipated.    Lauraine Kitty, FNP

## 2024-01-28 NOTE — Patient Instructions (Addendum)
 Advised to stop atorvastatin  while on Paxlovid  (anti-viral)

## 2024-01-28 NOTE — Telephone Encounter (Signed)
 Patient called back in she states she woke up with body aches, she had a fever 100 last night and 99.1 this morning. She states she treated with Advil . She states her head and throat are still hurting as well. Patient requesting virtual visit to discuss symptoms and treatment. Patient scheduled for telehealth visit.

## 2024-04-13 DIAGNOSIS — H524 Presbyopia: Secondary | ICD-10-CM | POA: Diagnosis not present

## 2024-04-13 DIAGNOSIS — H52221 Regular astigmatism, right eye: Secondary | ICD-10-CM | POA: Diagnosis not present

## 2024-04-13 DIAGNOSIS — H43811 Vitreous degeneration, right eye: Secondary | ICD-10-CM | POA: Diagnosis not present

## 2024-04-13 DIAGNOSIS — H2513 Age-related nuclear cataract, bilateral: Secondary | ICD-10-CM | POA: Diagnosis not present

## 2024-04-13 DIAGNOSIS — H04123 Dry eye syndrome of bilateral lacrimal glands: Secondary | ICD-10-CM | POA: Diagnosis not present

## 2024-04-13 DIAGNOSIS — H5213 Myopia, bilateral: Secondary | ICD-10-CM | POA: Diagnosis not present

## 2024-06-18 ENCOUNTER — Other Ambulatory Visit: Payer: Self-pay | Admitting: Family Medicine

## 2024-06-20 DIAGNOSIS — Z124 Encounter for screening for malignant neoplasm of cervix: Secondary | ICD-10-CM | POA: Diagnosis not present

## 2024-06-20 DIAGNOSIS — Z1231 Encounter for screening mammogram for malignant neoplasm of breast: Secondary | ICD-10-CM | POA: Diagnosis not present

## 2024-06-20 DIAGNOSIS — N841 Polyp of cervix uteri: Secondary | ICD-10-CM | POA: Diagnosis not present

## 2024-06-20 DIAGNOSIS — Z1151 Encounter for screening for human papillomavirus (HPV): Secondary | ICD-10-CM | POA: Diagnosis not present

## 2024-06-20 DIAGNOSIS — Z01419 Encounter for gynecological examination (general) (routine) without abnormal findings: Secondary | ICD-10-CM | POA: Diagnosis not present

## 2024-08-29 ENCOUNTER — Encounter: Admitting: Family Medicine
